# Patient Record
Sex: Female | Born: 1990
Health system: Southern US, Community
[De-identification: ages and names within clinical notes are randomized; demographics above are authoritative.]

## PROBLEM LIST (undated history)

## (undated) DIAGNOSIS — F1290 Cannabis use, unspecified, uncomplicated: Secondary | ICD-10-CM

## (undated) DIAGNOSIS — M5126 Other intervertebral disc displacement, lumbar region: Secondary | ICD-10-CM

## (undated) DIAGNOSIS — R011 Cardiac murmur, unspecified: Secondary | ICD-10-CM

## (undated) DIAGNOSIS — M5416 Radiculopathy, lumbar region: Secondary | ICD-10-CM

## (undated) DIAGNOSIS — F909 Attention-deficit hyperactivity disorder, unspecified type: Secondary | ICD-10-CM

## (undated) DIAGNOSIS — F419 Anxiety disorder, unspecified: Secondary | ICD-10-CM

## (undated) DIAGNOSIS — F32A Depression, unspecified: Secondary | ICD-10-CM

## (undated) DIAGNOSIS — G43909 Migraine, unspecified, not intractable, without status migrainosus: Secondary | ICD-10-CM

## (undated) DIAGNOSIS — D689 Coagulation defect, unspecified: Secondary | ICD-10-CM

## (undated) DIAGNOSIS — Z87442 Personal history of urinary calculi: Secondary | ICD-10-CM

## (undated) DIAGNOSIS — F4310 Post-traumatic stress disorder, unspecified: Secondary | ICD-10-CM

## (undated) DIAGNOSIS — F329 Major depressive disorder, single episode, unspecified: Secondary | ICD-10-CM

## (undated) DIAGNOSIS — R519 Headache, unspecified: Secondary | ICD-10-CM

## (undated) HISTORY — DX: Depression, unspecified: F32.A

## (undated) HISTORY — DX: Anxiety disorder, unspecified: F41.9

## (undated) HISTORY — DX: Coagulation defect, unspecified: D68.9

## (undated) HISTORY — PX: TUBAL LIGATION: SHX77

---
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
## 2008-05-10 ENCOUNTER — Emergency Department: Payer: Self-pay | Admitting: Emergency Medicine

## 2009-07-12 ENCOUNTER — Emergency Department: Payer: Self-pay | Admitting: Emergency Medicine

## 2010-04-14 ENCOUNTER — Emergency Department: Payer: Self-pay | Admitting: Emergency Medicine

## 2010-05-31 ENCOUNTER — Emergency Department: Payer: Self-pay | Admitting: Emergency Medicine

## 2011-01-23 ENCOUNTER — Inpatient Hospital Stay: Payer: Self-pay

## 2011-01-27 LAB — PATHOLOGY REPORT

## 2011-08-21 ENCOUNTER — Emergency Department: Payer: Self-pay | Admitting: Unknown Physician Specialty

## 2012-01-06 ENCOUNTER — Emergency Department: Payer: Self-pay | Admitting: Emergency Medicine

## 2012-01-06 LAB — URINALYSIS, COMPLETE
Bilirubin,UR: NEGATIVE
Nitrite: NEGATIVE
Ph: 7 (ref 4.5–8.0)
RBC,UR: 681 /HPF (ref 0–5)
Squamous Epithelial: <1

## 2012-01-07 LAB — PREGNANCY, URINE: Pregnancy Test, Urine: NEGATIVE m[IU]/mL

## 2012-01-29 ENCOUNTER — Emergency Department: Payer: Self-pay | Admitting: Emergency Medicine

## 2012-01-29 LAB — PREGNANCY, URINE: Pregnancy Test, Urine: NEGATIVE m[IU]/mL

## 2012-04-13 ENCOUNTER — Emergency Department: Payer: Self-pay | Admitting: Emergency Medicine

## 2012-11-01 ENCOUNTER — Emergency Department: Payer: Self-pay | Admitting: Internal Medicine

## 2013-09-08 ENCOUNTER — Emergency Department: Payer: Self-pay | Admitting: Emergency Medicine

## 2013-09-08 LAB — URINALYSIS, COMPLETE
BACTERIA: NONE SEEN
Bilirubin,UR: NEGATIVE
GLUCOSE, UR: NEGATIVE mg/dL (ref 0–75)
KETONE: NEGATIVE
Nitrite: NEGATIVE
Ph: 5 (ref 4.5–8.0)
Protein: 100
RBC,UR: 54 /HPF (ref 0–5)
Specific Gravity: 1.024 (ref 1.003–1.030)

## 2013-09-10 LAB — URINE CULTURE

## 2014-02-09 ENCOUNTER — Emergency Department: Payer: Self-pay | Admitting: Emergency Medicine

## 2014-02-09 LAB — COMPREHENSIVE METABOLIC PANEL
ALT: 21 U/L (ref 12–78)
AST: 24 U/L (ref 15–37)
Albumin: 3.9 g/dL (ref 3.4–5.0)
Alkaline Phosphatase: 63 U/L
Anion Gap: 6 — ABNORMAL LOW (ref 7–16)
BUN: 18 mg/dL (ref 7–18)
Bilirubin,Total: 0.3 mg/dL (ref 0.2–1.0)
CHLORIDE: 104 mmol/L (ref 98–107)
CREATININE: 0.74 mg/dL (ref 0.60–1.30)
Calcium, Total: 9.4 mg/dL (ref 8.5–10.1)
Co2: 27 mmol/L (ref 21–32)
EGFR (African American): >60
GLUCOSE: 81 mg/dL (ref 65–99)
OSMOLALITY: 275 (ref 275–301)
POTASSIUM: 3.4 mmol/L — AB (ref 3.5–5.1)
Sodium: 137 mmol/L (ref 136–145)
Total Protein: 7.3 g/dL (ref 6.4–8.2)

## 2014-02-09 LAB — URINALYSIS, COMPLETE
Bacteria: NONE SEEN
Bilirubin,UR: NEGATIVE
Glucose,UR: NEGATIVE mg/dL (ref 0–75)
Ketone: NEGATIVE
NITRITE: NEGATIVE
PH: 5 (ref 4.5–8.0)
Protein: 100
RBC,UR: 112 /HPF (ref 0–5)
SPECIFIC GRAVITY: 1.021 (ref 1.003–1.030)
Squamous Epithelial: 6
WBC UR: 1242 /HPF (ref 0–5)

## 2014-02-09 LAB — CBC
HCT: 40.2 % (ref 35.0–47.0)
HGB: 13.6 g/dL (ref 12.0–16.0)
MCH: 30.6 pg (ref 26.0–34.0)
MCHC: 33.8 g/dL (ref 32.0–36.0)
MCV: 91 fL (ref 80–100)
PLATELETS: 311 10*3/uL (ref 150–440)
RBC: 4.44 10*6/uL (ref 3.80–5.20)
RDW: 13.4 % (ref 11.5–14.5)
WBC: 16.8 10*3/uL — ABNORMAL HIGH (ref 3.6–11.0)

## 2014-04-01 ENCOUNTER — Emergency Department: Payer: Self-pay | Admitting: Emergency Medicine

## 2015-01-06 NOTE — H&P (Signed)
L&D Evaluation:  History Expanded:   HPI 24 yo G3P1011 whose EDC = 01/29/11 presents with active labor.    Blood Type O positive    Maternal HIV Negative    Maternal Syphilis Ab Nonreactive    Maternal Varicella Immune    Rubella Results immune    Presents with abdominal pain    Patient's Medical History Migraines    Patient's Surgical History none    Medications Pre Natal Vitamins    Allergies NKDA    Social History tobacco   Exam:   Vital Signs BP >140/90    General labor    Chest clear    Heart normal sinus rhythm    Abdomen gravid, tender with contractions    Estimated Fetal Weight Average for gestational age    Pelvic 6 cm    Mebranes Intact    FHT normal rate with no decels    Other Pt desires PPTL, All methods of contraception, both temporary and permanent, have been discussed with this patient.  She understands that tubal ligation is meant to be permanent and absolute although there is a one in 300 failure rate. The patient was informed about both the short and long term potential complications of tubal ligation.  She understands the risks of this surgery, including but not limited to, risks of anesthesia, hemorrhage, infection, injury to adjacent structures, bowel, bladder, and blood vessels.  She also understands that any of the [facility] physicians (Drs. Pascal Luxosenow, Weaver-Lee, Klett and [place]) might be the physician who performs the surgery and is agreeable with that.   Impression:   Impression active labor   Electronic Signatures: Towana Badgerosenow, Kennieth Plotts J (MD)  (Signed 27-May-12 10:34)  Entered: L&D Evaluation,  Authored: L&D Evaluation  Last Updated: 27-May-12 10:34

## 2015-01-27 ENCOUNTER — Encounter: Payer: Self-pay | Admitting: Emergency Medicine

## 2015-01-27 ENCOUNTER — Emergency Department
Admission: EM | Admit: 2015-01-27 | Discharge: 2015-01-27 | Disposition: A | Discharge status: Home / Self Care | Payer: 59 | Attending: Emergency Medicine | Admitting: Emergency Medicine

## 2015-01-27 ENCOUNTER — Telehealth: Payer: Self-pay | Admitting: Emergency Medicine

## 2015-01-27 DIAGNOSIS — Y9289 Other specified places as the place of occurrence of the external cause: Secondary | ICD-10-CM | POA: Insufficient documentation

## 2015-01-27 DIAGNOSIS — S3992XA Unspecified injury of lower back, initial encounter: Secondary | ICD-10-CM | POA: Diagnosis not present

## 2015-01-27 DIAGNOSIS — Y998 Other external cause status: Secondary | ICD-10-CM | POA: Diagnosis not present

## 2015-01-27 DIAGNOSIS — X58XXXA Exposure to other specified factors, initial encounter: Secondary | ICD-10-CM | POA: Insufficient documentation

## 2015-01-27 DIAGNOSIS — M5126 Other intervertebral disc displacement, lumbar region: Secondary | ICD-10-CM | POA: Diagnosis not present

## 2015-01-27 DIAGNOSIS — Z79899 Other long term (current) drug therapy: Secondary | ICD-10-CM | POA: Insufficient documentation

## 2015-01-27 DIAGNOSIS — Y9389 Activity, other specified: Secondary | ICD-10-CM | POA: Insufficient documentation

## 2015-01-27 DIAGNOSIS — M545 Low back pain: Secondary | ICD-10-CM | POA: Diagnosis present

## 2015-01-27 MED ORDER — IBUPROFEN 800 MG PO TABS: 800.0000 mg | ORAL_TABLET | Freq: Three times a day (TID) | ORAL | Status: DC | PRN

## 2015-01-27 MED ORDER — KETOROLAC TROMETHAMINE 60 MG/2ML IM SOLN
INTRAMUSCULAR | Status: AC
  Administered 2015-01-27: 60 mg
  Filled 2015-01-27: qty 2

## 2015-01-27 MED ORDER — HYDROCODONE-ACETAMINOPHEN 5-325 MG PO TABS: 1.0000 | ORAL_TABLET | ORAL | Status: DC | PRN

## 2015-01-27 MED ORDER — CYCLOBENZAPRINE HCL 10 MG PO TABS: 10.0000 mg | ORAL_TABLET | Freq: Three times a day (TID) | ORAL | Status: DC | PRN

## 2015-01-27 MED ORDER — KETOROLAC TROMETHAMINE 30 MG/ML IJ SOLN
60.0000 mg | Freq: Once | INTRAMUSCULAR | Status: AC
  Administered 2015-01-27: 60 mg via INTRAMUSCULAR

## 2015-01-27 NOTE — ED Notes (Signed)
Reports coughing this am and felt a pop in back, painful since

## 2015-01-27 NOTE — ED Notes (Signed)
Pt informed to return if any life threatening symptoms occur.  

## 2015-01-27 NOTE — ED Notes (Signed)
Pt says she need something saying whether she needs to have work restrictions as she may have to lift 40 pound items at work. i told her to be evaluated by pcp to make sure it is safe to lift that much.

## 2015-01-27 NOTE — ED Provider Notes (Signed)
[facility] Emergency Department Provider Note  ____________________________________________  Time seen: Approximately 10:28 AM  I have reviewed the triage vital signs and the nursing notes.   HISTORY  Chief Complaint Back Pain    HPI Martha Oconnor is a 24 y.o. female who presents to the emergency room for evaluation of sudden onset low back pain this morning. Patient states that she coughed and felt something pop and pull muscle in her lower back. Reports tried some heat home to no relief describes pain as 10 over 10. Unable to find a comfortable position pain is made worse with movement and nothing seems to relieve it.   History reviewed. No pertinent past medical history.  There are no active problems to display for this patient.   Past Surgical History  Procedure Laterality Date  . Tubal ligation      Current Outpatient Rx  Name  Route  Sig  Dispense  Refill  . cyclobenzaprine (FLEXERIL) 10 MG tablet   Oral   Take 1 tablet (10 mg total) by mouth every 8 (eight) hours as needed for muscle spasms.   30 tablet   1   . HYDROcodone-acetaminophen (NORCO) 5-325 MG per tablet   Oral   Take 1 tablet by mouth every 4 (four) hours as needed for moderate pain.   10 tablet   0   . ibuprofen (ADVIL,MOTRIN) 800 MG tablet   Oral   Take 1 tablet (800 mg total) by mouth every 8 (eight) hours as needed.   30 tablet   0     Allergies Ceclor  History reviewed. No pertinent family history.  Social History History  Substance Use Topics  . Smoking status: Never Smoker   . Smokeless tobacco: Not on file  . Alcohol Use: Yes    Review of Systems Constitutional: No fever/chills Eyes: No visual changes. ENT: No sore throat. Cardiovascular: Denies chest pain. Respiratory: Denies shortness of breath. Gastrointestinal: No abdominal pain.  No nausea, no vomiting.  No diarrhea.  No constipation. Genitourinary: Negative for dysuria. Musculoskeletal:  Positive for back pain. Skin: Negative for rash. Neurological: Negative for headaches, focal weakness or numbness.  10-point ROS otherwise negative.  ____________________________________________   PHYSICAL EXAM:  VITAL SIGNS: ED Triage Vitals  Enc Vitals Group     BP 01/27/15 1013 126/90 mmHg     Pulse Rate 01/27/15 1013 95     Resp 01/27/15 1013 20     Temp 01/27/15 1013 98.3 F (36.8 C)     Temp Source 01/27/15 1013 Oral     SpO2 01/27/15 1013 99 %     Weight 01/27/15 1013 175 lb (79.379 kg)     Height 01/27/15 1013 5\' 5"  (1.651 m)     Head Cir --      Peak Flow --      Pain Score 01/27/15 1014 10     Pain Loc --      Pain Edu? --      Excl. in GC? --     Constitutional: Alert and oriented. Well appearing and in no acute distress.   Cardiovascular: Normal rate, regular rhythm. Grossly normal heart sounds.  Good peripheral circulation. Respiratory: Normal respiratory effort.  No retractions. Lungs CTAB. Gastrointestinal: Soft and nontender. No distention. No abdominal bruits. No CVA tenderness. Musculoskeletal: No lower extremity tenderness nor edema.  No joint effusions. Straight leg positive bilaterally at 15. Unable to flex due to pain. Neurologic:  Normal speech and language. No gross  focal neurologic deficits are appreciated. Speech is normal. No gait instability. Skin:  Skin is warm, dry and intact. No rash noted. Psychiatric: Mood and affect are normal. Speech and behavior are normal.  ____________________________________________   LABS (all labs ordered are listed, but only abnormal results are displayed)  Labs Reviewed - No data to display ____________________________________________  EKG  Not applicable ____________________________________________  RADIOLOGY Deferred ____________________________________________   PROCEDURES  Procedure(s) performed: None  Critical Care performed: No  ____________________________________________   INITIAL  IMPRESSION / ASSESSMENT AND PLAN / ED COURSE  Pertinent labs & imaging results that were available during my care of the patient were reviewed by me and considered in my medical decision making (see chart for details).  Diagnosed with acute low back pain. Toradol 60 mg IM given Rx given for ibuprofen 800, Flexeril 10 mg, and a 2 day course of Norco 5/325 given. She voices no other complaints at this time. Will follow up with PCP or return to the ER if symptoms worsen. ____________________________________________   FINAL CLINICAL IMPRESSION(S) / ED DIAGNOSES  Final diagnoses:  Low back pain due to displacement of intervertebral disc      Evangeline Dakin, PA-C 01/27/15 1059  Jene Every, MD 01/27/15 1409

## 2015-02-03 ENCOUNTER — Ambulatory Visit: Payer: 59 | Admitting: Physical Therapy

## 2015-08-30 ENCOUNTER — Emergency Department
Admission: EM | Admit: 2015-08-30 | Discharge: 2015-08-30 | Disposition: A | Discharge status: Home / Self Care | Payer: 59 | Attending: Emergency Medicine | Admitting: Emergency Medicine

## 2015-08-30 ENCOUNTER — Encounter: Payer: Self-pay | Admitting: Emergency Medicine

## 2015-08-30 DIAGNOSIS — R05 Cough: Secondary | ICD-10-CM | POA: Diagnosis present

## 2015-08-30 DIAGNOSIS — J40 Bronchitis, not specified as acute or chronic: Secondary | ICD-10-CM

## 2015-08-30 DIAGNOSIS — J209 Acute bronchitis, unspecified: Secondary | ICD-10-CM | POA: Diagnosis not present

## 2015-08-30 MED ORDER — PSEUDOEPH-BROMPHEN-DM 30-2-10 MG/5ML PO SYRP: 10.0000 mL | ORAL_SOLUTION | Freq: Four times a day (QID) | ORAL | Status: DC | PRN

## 2015-08-30 MED ORDER — ALBUTEROL SULFATE HFA 108 (90 BASE) MCG/ACT IN AERS: 2.0000 | INHALATION_SPRAY | RESPIRATORY_TRACT | Status: DC | PRN

## 2015-08-30 MED ORDER — PREDNISONE 10 MG PO TABS: 10.0000 mg | ORAL_TABLET | ORAL | Status: DC

## 2015-08-30 MED ORDER — AZITHROMYCIN 250 MG PO TABS: ORAL_TABLET | ORAL | Status: DC

## 2015-08-30 NOTE — ED Notes (Signed)
Cough and head cold started on Christmas  Eve.  Pain with inspiration and coughing.

## 2015-08-30 NOTE — ED Notes (Signed)
Reports cough and congestion, chest hurts when coughing, bodyaches. No resp distress

## 2015-08-30 NOTE — ED Provider Notes (Signed)
[facility] Emergency Department Provider Note ?  ? ____________________________________________ ? Time seen: 7:40 AM ? I have reviewed the triage vital signs and the nursing notes.  ________ HISTORY ? Chief Complaint Cough     HPI  Martha Oconnor is a 25 y.o. female   who presents emergency department complaining of cold like symptoms 8 days. She states that initially her symptoms began with some nasal congestion and mild sore throat. Patient states that she has began to have an increase in coughing and "chest tightness". Patient states that the cough is dry in nature. Any exertion results in coughing. Patient denies any shortness of breath, headache, neck pain, nausea or vomiting, diarrhea or constipation. She denies any fevers or chills. ? ? ? History reviewed. No pertinent past medical history.  There are no active problems to display for this patient.  ? Past Surgical History  Procedure Laterality Date  . Tubal ligation     ? Current Outpatient Rx  Name  Route  Sig  Dispense  Refill  . albuterol (PROVENTIL HFA;VENTOLIN HFA) 108 (90 Base) MCG/ACT inhaler   Inhalation   Inhale 2 puffs into the lungs every 4 (four) hours as needed for wheezing or shortness of breath.   1 Inhaler   0   . azithromycin (ZITHROMAX Z-PAK) 250 MG tablet      Take 2 tablets (500 mg) on  Day 1,  followed by 1 tablet (250 mg) once daily on Days 2 through 5.   6 each   0   . brompheniramine-pseudoephedrine-DM 30-2-10 MG/5ML syrup   Oral   Take 10 mLs by mouth 4 (four) times daily as needed.   200 mL   0   . cyclobenzaprine (FLEXERIL) 10 MG tablet   Oral   Take 1 tablet (10 mg total) by mouth every 8 (eight) hours as needed for muscle spasms.   30 tablet   1   . HYDROcodone-acetaminophen (NORCO) 5-325 MG per tablet   Oral   Take 1 tablet by mouth every 4 (four) hours as needed for moderate pain.   10 tablet   0   . ibuprofen (ADVIL,MOTRIN) 800 MG  tablet   Oral   Take 1 tablet (800 mg total) by mouth every 8 (eight) hours as needed.   30 tablet   0   . predniSONE (DELTASONE) 10 MG tablet   Oral   Take 1 tablet (10 mg total) by mouth as directed.   21 tablet   0     Take on a daily basis of 6, 5, 4, 3, 2, 1    ? Allergies Ceclor ? History reviewed. No pertinent family history. ? Social History Social History  Substance Use Topics  . Smoking status: Never Smoker   . Smokeless tobacco: None  . Alcohol Use: Yes   ? Review of Systems Constitutional: no fever. Eyes: no discharge ENT: Endorses "scratchy" throat. Endorses minor nasal congestion. Cardiovascular: no chest pain. Respiratory: Positive for cough. No sob Gastrointestinal: denies abdominal pain, vomiting, diarrhea, and constipation Genitourinary: no dysuria. Negative for hematuria Musculoskeletal: Negative for back pain. Skin: Negative for rash. Neurological: Negative for headaches  10-point ROS otherwise negative.  _______________ PHYSICAL EXAM: ? VITAL SIGNS:   ED Triage Vitals  Enc Vitals Group     BP 08/30/15 0713 126/59 mmHg     Pulse Rate 08/30/15 0713 90     Resp 08/30/15 0713 18     Temp 08/30/15 0713 97.9 F (  36.6 C)     Temp Source 08/30/15 0713 Oral     SpO2 08/30/15 0713 99 %     Weight 08/30/15 0713 160 lb (72.576 kg)     Height 08/30/15 0713 5\' 5"  (1.651 m)     Head Cir --      Peak Flow --      Pain Score 08/30/15 0713 3     Pain Loc --      Pain Edu? --      Excl. in GC? --    ?  Constitutional: Alert and oriented. Well appearing and in no distress. Eyes: Conjunctivae are normal.  ENT      Head: Normocephalic and atraumatic.      Ears: EACs and TMs are unremarkable bilaterally.      Nose: Mild clear congestion/rhinnorhea.      Mouth/Throat: Mucous membranes are moist.   Hematological/Lymphatic/Immunilogical: Diffuse, mobile, nontender anterior cervical lymphadenopathy. Cardiovascular: Normal rate, regular rhythm.  Normal S1 and S2. Respiratory: Normal respiratory effort without tachypnea nor retractions. Lungs with scattered expiratory wheezing. No rales or rhonchi. No absent or decreased breath sounds. Gastrointestinal: Soft and nontender. No distention. There is no CVA tenderness. Genitourinary:  Musculoskeletal: Nontender with normal range of motion in all extremities.  Neurologic:  Normal speech and language. No gross focal neurologic deficits are appreciated. Skin:  Skin is warm, dry and intact. No rash noted. Psychiatric: Mood and affect are normal. Speech and behavior are normal. Patient exhibits appropriate insight and judgment.    LABS (all labs ordered are listed, but only abnormal results are displayed)  Labs Reviewed - No data to display  ___________ RADIOLOGY    _____________ PROCEDURES ? Procedure(s) performed:    Medications - No data to display  ______________________________________________________ INITIAL IMPRESSION / ASSESSMENT AND PLAN / ED COURSE ? Pertinent labs & imaging results that were available during my care of the patient were reviewed by me and considered in my medical decision making (see chart for details).    Patient's symptoms are consistent with bronchitis. Due to length of symptoms patient will be placed on antibiotics, steroids, albuterol, and cough medication. Patient is to follow-up with primary care for any symptoms persisting past this treatment course. Patient will return to the emergency department me increase or worsening of her symptoms. Patient verbalizes understanding of her diagnosis and the treatment plan and verbalizes compliance with same.    New Prescriptions   ALBUTEROL (PROVENTIL HFA;VENTOLIN HFA) 108 (90 BASE) MCG/ACT INHALER    Inhale 2 puffs into the lungs every 4 (four) hours as needed for wheezing or shortness of breath.   AZITHROMYCIN (ZITHROMAX Z-PAK) 250 MG TABLET    Take 2 tablets (500 mg) on  Day 1,  followed by 1 tablet  (250 mg) once daily on Days 2 through 5.   BROMPHENIRAMINE-PSEUDOEPHEDRINE-DM 30-2-10 MG/5ML SYRUP    Take 10 mLs by mouth 4 (four) times daily as needed.   PREDNISONE (DELTASONE) 10 MG TABLET    Take 1 tablet (10 mg total) by mouth as directed.   ____________________________________________ FINAL CLINICAL IMPRESSION(S) / ED DIAGNOSES?  Final diagnoses:  Bronchitis            Racheal PatchesJonathan D Dashanae Longfield, PA-C 08/30/15 [id number]  Jennye MoccasinBrian S Quigley, MD 08/30/15 [id number]

## 2015-08-30 NOTE — Discharge Instructions (Signed)

## 2016-02-02 ENCOUNTER — Other Ambulatory Visit
Admission: RE | Admit: 2016-02-02 | Discharge: 2016-02-02 | Disposition: A | Discharge status: Home / Self Care | Payer: 59 | Attending: Family Medicine | Admitting: Family Medicine

## 2016-02-02 NOTE — ED Notes (Signed)
Patient here for forensic blood draw only.  Drawn per hospital policy, and [place] [employer]. Pt tolerated well and was calm and cooperative.

## 2018-07-29 ENCOUNTER — Encounter: Payer: Self-pay | Admitting: Emergency Medicine

## 2018-07-29 ENCOUNTER — Emergency Department
Admission: EM | Admit: 2018-07-29 | Discharge: 2018-07-29 | Disposition: A | Discharge status: Home / Self Care | Payer: Medicaid Other | Attending: Emergency Medicine | Admitting: Emergency Medicine

## 2018-07-29 DIAGNOSIS — Z711 Person with feared health complaint in whom no diagnosis is made: Secondary | ICD-10-CM | POA: Diagnosis present

## 2018-07-29 NOTE — ED Notes (Signed)
Dr. Leanne Changeifenbark was in with pt and pt got upset and left because pt wanted blood work to be drawn to proof that she was not intoxicated tonight when she received a DUI.

## 2018-07-29 NOTE — Discharge Instructions (Signed)
Please follow up with your PMD as needed and return to the ED sooner for any concerns.

## 2018-07-29 NOTE — ED Provider Notes (Signed)
[facility] Emergency Department Provider Note  ____________________________________________   First MD Initiated Contact with Patient 07/29/18 [phone]     (approximate)  I have reviewed the triage vital signs and the nursing notes.   HISTORY  Chief Complaint Labs Only   HPI Martha Oconnor is a 27 y.o. female who self presents to the emergency department requesting an alcohol test to be done.  Tonight she was pulled over for driving under the influence of alcohol and says that she was breathalyzed and her blood alcohol level via breathalyzer was 0.10%.  She was cited and released.  The patient says she was "not drunk" and that she drank 2 drinks rapidly and then got in the car and drove and she feels the breathalyzer was inaccurate as she had more alcohol on her breath then was in her system.  She comes to the emergency department requesting a blood draw to prove that "I was not ever drunk".  She does not arrive with police.  There is no chain of custody.   She has no medical complaints.   History reviewed. No pertinent past medical history.  There are no active problems to display for this patient.   Past Surgical History:  Procedure Laterality Date  . TUBAL LIGATION      Prior to Admission medications   Medication Sig Start Date End Date Taking? Authorizing Provider  albuterol (PROVENTIL HFA;VENTOLIN HFA) 108 (90 Base) MCG/ACT inhaler Inhale 2 puffs into the lungs every 4 (four) hours as needed for wheezing or shortness of breath. 08/30/15   Cuthriell, Delorise Royals, PA-C  azithromycin (ZITHROMAX Z-PAK) 250 MG tablet Take 2 tablets (500 mg) on  Day 1,  followed by 1 tablet (250 mg) once daily on Days 2 through 5. 08/30/15   Cuthriell, Delorise Royals, PA-C  brompheniramine-pseudoephedrine-DM 30-2-10 MG/5ML syrup Take 10 mLs by mouth 4 (four) times daily as needed. 08/30/15   Cuthriell, Delorise Royals, PA-C  cyclobenzaprine (FLEXERIL) 10 MG tablet Take 1 tablet (10 mg total)  by mouth every 8 (eight) hours as needed for muscle spasms. 01/27/15   Beers, Charmayne Sheer, PA-C  HYDROcodone-acetaminophen (NORCO) 5-325 MG per tablet Take 1 tablet by mouth every 4 (four) hours as needed for moderate pain. 01/27/15   Beers, Charmayne Sheer, PA-C  ibuprofen (ADVIL,MOTRIN) 800 MG tablet Take 1 tablet (800 mg total) by mouth every 8 (eight) hours as needed. 01/27/15   Beers, Charmayne Sheer, PA-C  predniSONE (DELTASONE) 10 MG tablet Take 1 tablet (10 mg total) by mouth as directed. 08/30/15   Cuthriell, Delorise Royals, PA-C    Allergies Ceclor [cefaclor]  No family history on file.  Social History Social History   Tobacco Use  . Smoking status: Never Smoker  . Smokeless tobacco: Never Used  Substance Use Topics  . Alcohol use: Yes  . Drug use: Not on file    Review of Systems Constitutional: No fever/chills Cardiovascular: Denies chest pain. Respiratory: Denies shortness of breath. Gastrointestinal: No abdominal pain.  No nausea, no vomiting.   Musculoskeletal: Negative for back pain. Neurological: Negative for headaches   ____________________________________________   PHYSICAL EXAM:  VITAL SIGNS: ED Triage Vitals  Enc Vitals Group     BP 07/29/18 0527 140/85     Pulse Rate 07/29/18 0527 (!) 101     Resp 07/29/18 0527 18     Temp 07/29/18 0527 98.7 F (37.1 C)     Temp Source 07/29/18 0527 Oral     SpO2  07/29/18 0527 99 %     Weight 07/29/18 0524 180 lb (81.6 kg)     Height 07/29/18 0524 5\' 4"  (1.626 m)     Head Circumference --      Peak Flow --      Pain Score 07/29/18 0524 0     Pain Loc --      Pain Edu? --      Excl. in GC? --     Constitutional: Alert and oriented x4 somewhat strange in her erratic behavior.  Head: Atraumatic.  Pupils are midrange and brisk Cardiovascular: Regular rate and rhythm Respiratory: Normal respiratory effort.  No retractions. Neurologic:  Normal speech and language. No gross focal neurologic deficits are appreciated.  Skin:  Skin  is warm, dry and intact. No rash noted.    ____________________________________________  LABS (all labs ordered are listed, but only abnormal results are displayed)  Labs Reviewed - No data to display   __________________________________________  EKG   ____________________________________________  RADIOLOGY   ____________________________________________   DIFFERENTIAL includes but not limited to  Alcohol intoxication, anxiety, anger reaction   PROCEDURES  Procedure(s) performed: no  Procedures  Critical Care performed: no  ____________________________________________   INITIAL IMPRESSION / ASSESSMENT AND PLAN / ED COURSE  Pertinent labs & imaging results that were available during my care of the patient were reviewed by me and considered in my medical decision making (see chart for details).   As part of my medical decision making, I reviewed the following data within the electronic MEDICAL RECORD NUMBER History obtained from family if available, nursing notes, old chart and ekg, as well as notes from prior ED visits.  I explained to the patient that I would be happy to draw her blood to see what her alcohol level is however with no chain of custody that this would not be any benefit to her.  She became frustrated and said that she was under the impression that she could have an alcohol test performed and it would "stand up in court improve I was innocent".  When I told her that I did not believe this was the case she became frustrated, got up, and left.  She is not driving.  She has capacity to make medical decisions.  She has no acute medical issues.  She is discharged.      ____________________________________________   FINAL CLINICAL IMPRESSION(S) / ED DIAGNOSES  Final diagnoses:  Feared condition not demonstrated      NEW MEDICATIONS STARTED DURING THIS VISIT:  Discharge Medication List as of 07/29/2018  6:04 AM       Note:  This document was  prepared using Dragon voice recognition software and may include unintentional dictation errors.      Merrily Brittleifenbark, Brendt Dible, MD 07/29/18 [phone]

## 2018-07-29 NOTE — ED Triage Notes (Addendum)
Patient states that she was pulled by police and is now here requesting a blood draw for court.

## 2019-02-08 LAB — HM HIV SCREENING LAB: HM HIV Screening: NEGATIVE

## 2019-04-22 ENCOUNTER — Other Ambulatory Visit: Payer: Self-pay

## 2019-04-22 DIAGNOSIS — Z20822 Contact with and (suspected) exposure to covid-19: Secondary | ICD-10-CM

## 2019-04-23 LAB — NOVEL CORONAVIRUS, NAA: SARS-CoV-2, NAA: NOT DETECTED

## 2019-04-23 LAB — SPECIMEN STATUS REPORT

## 2019-05-27 ENCOUNTER — Ambulatory Visit: Payer: Medicaid Other | Admitting: Physician Assistant

## 2019-05-27 ENCOUNTER — Encounter: Payer: Self-pay | Admitting: Physician Assistant

## 2019-05-27 ENCOUNTER — Other Ambulatory Visit: Payer: Self-pay

## 2019-05-27 DIAGNOSIS — Z113 Encounter for screening for infections with a predominantly sexual mode of transmission: Secondary | ICD-10-CM | POA: Diagnosis not present

## 2019-05-27 DIAGNOSIS — Z9851 Tubal ligation status: Secondary | ICD-10-CM | POA: Insufficient documentation

## 2019-05-27 LAB — WET PREP FOR TRICH, YEAST, CLUE
Trichomonas Exam: NEGATIVE
Yeast Exam: NEGATIVE

## 2019-05-27 NOTE — Progress Notes (Signed)
    STI clinic/screening visit  Subjective:  Martha Oconnor is a 28 y.o. female being seen today for an STI screening visit. The patient reports they do not have symptoms.  Patient has the following medical conditions:  There are no active problems to display for this patient.    Chief Complaint  Patient presents with  . SEXUALLY TRANSMITTED DISEASE    HPI  Patient reports that she would like a screening today.  Denies any symptoms or concerns.    See flowsheet for further details and programmatic requirements.    The following portions of the patient's history were reviewed and updated as appropriate: allergies, current medications, past medical history, past social history, past surgical history and problem list.  Objective:  There were no vitals filed for this visit.  Physical Exam Constitutional:      General: She is not in acute distress.    Appearance: Normal appearance.  HENT:     Head: Normocephalic and atraumatic.     Mouth/Throat:     Mouth: Mucous membranes are moist.     Pharynx: Oropharynx is clear. No oropharyngeal exudate or posterior oropharyngeal erythema.  Eyes:     Conjunctiva/sclera: Conjunctivae normal.  Neck:     Musculoskeletal: Neck supple.  Pulmonary:     Effort: Pulmonary effort is normal.  Abdominal:     Palpations: Abdomen is soft. There is no mass.     Tenderness: There is no abdominal tenderness. There is no guarding or rebound.  Genitourinary:    General: Normal vulva.     Rectum: Normal.     Comments: External genitalia/pubic area without nits, lice, edema, erythema, lesions and inguinal adenopathy. Vagina with normal mucosa and discharge. Cervix without visible lesions. Uterus firm, mobile, nt, no masses, no CMT, no adnexal tenderness or fullness. Lymphadenopathy:     Cervical: No cervical adenopathy.  Skin:    General: Skin is warm and dry.     Findings: No bruising, erythema, lesion or rash.  Neurological:     Mental Status:  She is alert and oriented to person, place, and time.  Psychiatric:        Mood and Affect: Mood normal.        Behavior: Behavior normal.        Thought Content: Thought content normal.        Judgment: Judgment normal.       Assessment and Plan:  Martha Oconnor is a 28 y.o. female presenting to the [facility] Department for STI screening  1. Screening for STD (sexually transmitted disease) Patient without symptoms.   Rec condoms with all sex. Await test results.  Counseled that RN will call if needs to RTC for treatment once results are back. PCP list given to patient to establish care for age appropriate screenings and regular PE and paps. - WET PREP FOR [place], [place], [place] [facility] - Syphilis Serology, [place] [facility]     No follow-ups on file.  No future appointments.  Jerene Dilling, PA

## 2019-05-27 NOTE — Progress Notes (Signed)
Here today for STD screening. Accepts bloodwork. Augustin Bun, RN  Wet Prep results reviewed. Per standing orders no treatment indicated. Laquan Ludden, RN   

## 2019-09-17 ENCOUNTER — Ambulatory Visit: Payer: Medicaid Other | Attending: Internal Medicine

## 2019-09-17 DIAGNOSIS — Z20822 Contact with and (suspected) exposure to covid-19: Secondary | ICD-10-CM

## 2019-09-18 LAB — NOVEL CORONAVIRUS, NAA: SARS-CoV-2, NAA: NOT DETECTED

## 2019-11-01 ENCOUNTER — Other Ambulatory Visit: Payer: Self-pay

## 2019-11-01 ENCOUNTER — Ambulatory Visit: Payer: Medicaid Other | Admitting: Physician Assistant

## 2019-11-01 ENCOUNTER — Encounter: Payer: Self-pay | Admitting: Physician Assistant

## 2019-11-01 DIAGNOSIS — Z113 Encounter for screening for infections with a predominantly sexual mode of transmission: Secondary | ICD-10-CM

## 2019-11-01 LAB — WET PREP FOR TRICH, YEAST, CLUE
Trichomonas Exam: NEGATIVE
Yeast Exam: NEGATIVE

## 2019-11-01 NOTE — Progress Notes (Signed)
Wet mount reviewed and no interventions required per standing order. Jossie Ng, RN

## 2019-11-02 NOTE — Progress Notes (Signed)
  [facility] Department STI clinic/screening visit  Subjective:  Martha Oconnor is a 29 y.o. female being seen today for an STI screening visit. The patient reports they do not have symptoms.  Patient reports that they do not desire a pregnancy in the next year.   They reported they are not interested in discussing contraception today.  Patient's last menstrual period was 10/12/2019 (exact date).   Patient has the following medical conditions:   Patient Active Problem List   Diagnosis Date Noted  . History of bilateral tubal ligation 05/27/2019    Chief Complaint  Patient presents with  . SEXUALLY TRANSMITTED DISEASE    HPI  Patient reports that she is not having any symptoms but would like a screening today.  Denies changes to her history and regular medicines.    See flowsheet for further details and programmatic requirements.    The following portions of the patient's history were reviewed and updated as appropriate: allergies, current medications, past medical history, past social history, past surgical history and problem list.  Objective:  There were no vitals filed for this visit.  Physical Exam Constitutional:      General: She is not in acute distress.    Appearance: Normal appearance. She is normal weight.  HENT:     Head: Normocephalic.     Comments: No nits, lice, or hair loss. No cervical, supraclavicular or axillary adenopathy.    Mouth/Throat:     Mouth: Mucous membranes are moist.     Pharynx: Oropharynx is clear. No oropharyngeal exudate or posterior oropharyngeal erythema.  Eyes:     Conjunctiva/sclera: Conjunctivae normal.  Pulmonary:     Effort: Pulmonary effort is normal.  Abdominal:     Palpations: Abdomen is soft. There is no mass.     Tenderness: There is no abdominal tenderness. There is no guarding or rebound.  Genitourinary:    General: Normal vulva.     Rectum: Normal.     Comments: External genitalia/pubic area without nits,  lice, edema, erythema,lesions and inguinal adenopathy. Vagina with normal mucosa and discharge. Cervix without visible lesions. Uterus firm, mobile, nt, no masses, no CMT, no adnexal tenderness or fullness. Musculoskeletal:     Cervical back: Neck supple. No tenderness.  Skin:    General: Skin is warm and dry.     Findings: No bruising, erythema, lesion or rash.     Comments: Multiple tattoos.  Neurological:     Mental Status: She is alert and oriented to person, place, and time.  Psychiatric:        Mood and Affect: Mood normal.        Behavior: Behavior normal.        Thought Content: Thought content normal.        Judgment: Judgment normal.      Assessment and Plan:  Martha Oconnor is a 29 y.o. female presenting to the [facility] Department for STI screening  1. Screening for STD (sexually transmitted disease) Patient into clinic without symptoms. Rec condoms with all sex. Await test results.  Counseled that RN will call if needs to RTC for treatment once results are back. - WET PREP FOR TRICH, YEAST, CLUE - Gonococcus culture - Chlamydia/Gonorrhea [place] [facility] - HIV [place] [facility] - Syphilis Serology, [place] [facility]     No follow-ups on file.  No future appointments.  Matt Holmes, PA

## 2019-11-05 LAB — GONOCOCCUS CULTURE

## 2020-01-15 ENCOUNTER — Encounter: Payer: Self-pay | Admitting: Physician Assistant

## 2020-01-15 ENCOUNTER — Other Ambulatory Visit: Payer: Self-pay

## 2020-01-15 ENCOUNTER — Ambulatory Visit: Payer: Self-pay | Admitting: Physician Assistant

## 2020-01-15 DIAGNOSIS — Z113 Encounter for screening for infections with a predominantly sexual mode of transmission: Secondary | ICD-10-CM

## 2020-01-15 LAB — WET PREP FOR TRICH, YEAST, CLUE
Trichomonas Exam: NEGATIVE
Yeast Exam: NEGATIVE

## 2020-01-15 NOTE — Progress Notes (Signed)
  [facility] Department STI clinic/screening visit  Subjective:  Martha Oconnor is a 29 y.o. female being seen today for an STI screening visit. The patient reports they do not have symptoms.  Patient reports that they do not desire a pregnancy in the next year.   They reported they are not interested in discussing contraception today.  No LMP recorded.   Patient has the following medical conditions:   Patient Active Problem List   Diagnosis Date Noted  . History of bilateral tubal ligation 05/27/2019    Chief Complaint  Patient presents with  . SEXUALLY TRANSMITTED DISEASE    STD screening including bloodwork    HPI  Patient reports that she does not have any symptoms but would like a screening today.  LMP 01/07/2020 and normal.  BTL is BCM.  Denies current symptoms of anxiety or depression.   See flowsheet for further details and programmatic requirements.    The following portions of the patient's history were reviewed and updated as appropriate: allergies, current medications, past medical history, past social history, past surgical history and problem list.  Objective:  There were no vitals filed for this visit.  Physical Exam Constitutional:      General: She is not in acute distress.    Appearance: Normal appearance.  HENT:     Head: Normocephalic and atraumatic.     Comments: No nits, lice or hair loss. No cervical, supraclavicular or axillary adenopathy.    Mouth/Throat:     Mouth: Mucous membranes are moist.     Pharynx: Oropharynx is clear. No oropharyngeal exudate or posterior oropharyngeal erythema.  Eyes:     Conjunctiva/sclera: Conjunctivae normal.  Pulmonary:     Effort: Pulmonary effort is normal.  Abdominal:     Palpations: Abdomen is soft. There is no mass.     Tenderness: There is no abdominal tenderness. There is no guarding or rebound.  Genitourinary:    General: Normal vulva.     Rectum: Normal.     Comments: External  genitalia/pubic area without nits, lice, edema, erythema, lesions and inguinal adenopathy. Vagina with normal mucosa and discharge. Cervix without visible lesions. Uterus firm, mobile, nt, no masses, no CMT, no adnexal tenderness or fullness. Musculoskeletal:     Cervical back: Neck supple. No tenderness.  Skin:    General: Skin is warm and dry.     Findings: No bruising, erythema, lesion or rash.  Neurological:     Mental Status: She is alert and oriented to person, place, and time.  Psychiatric:        Mood and Affect: Mood normal.        Behavior: Behavior normal.        Thought Content: Thought content normal.        Judgment: Judgment normal.      Assessment and Plan:  Martha Oconnor is a 29 y.o. female presenting to the [facility] Department for STI screening  1. Screening for STD (sexually transmitted disease) Patient into clinic without symptoms. Rec condoms with all sex. Await test results.  Counseled that RN will call if needs to RTC for treatment once results are back. - WET PREP FOR TRICH, YEAST, CLUE - Chlamydia/Gonorrhea [place] [facility] - HIV [place] [facility] - Syphilis Serology, [place] [facility]     No follow-ups on file.  No future appointments.  Matt Holmes, PA

## 2020-01-15 NOTE — Progress Notes (Signed)
Wet mount reviewed, no tx per provider orders. Provider orders completed. 

## 2020-05-27 ENCOUNTER — Ambulatory Visit: Payer: Medicaid Other | Admitting: Family Medicine

## 2020-05-27 ENCOUNTER — Encounter: Payer: Self-pay | Admitting: Family Medicine

## 2020-05-27 ENCOUNTER — Other Ambulatory Visit: Payer: Self-pay

## 2020-05-27 DIAGNOSIS — Z113 Encounter for screening for infections with a predominantly sexual mode of transmission: Secondary | ICD-10-CM

## 2020-05-27 LAB — WET PREP FOR TRICH, YEAST, CLUE
Trichomonas Exam: NEGATIVE
Yeast Exam: NEGATIVE

## 2020-05-27 NOTE — Progress Notes (Signed)
  [facility] Department STI clinic/screening visit  Subjective:  Martha Oconnor is a 29 y.o. female being seen today for an STI screening visit. The patient reports they do not have symptoms.  Patient reports that they do not desire a pregnancy in the next year.   They reported they are not interested in discussing contraception today.  Patient's last menstrual period was 05/14/2020.   Patient has the following medical conditions:   Patient Active Problem List   Diagnosis Date Noted  . History of bilateral tubal ligation 05/27/2019    No chief complaint on file.   HPI  Patient reports she is here for STD screen denies symptoms.  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  Last HIV test per patient/review of record was 01/05/2020 Patient reports last pap was unknown.   See flowsheet for further details and programmatic requirements.    The following portions of the patient's history were reviewed and updated as appropriate: allergies, current medications, past medical history, past social history, past surgical history and problem list.  Objective:  There were no vitals filed for this visit.  Physical Exam Vitals and nursing note reviewed.  Constitutional:      Appearance: Normal appearance.  HENT:     Head: Normocephalic and atraumatic.     Mouth/Throat:     Mouth: Mucous membranes are moist.     Pharynx: Oropharynx is clear. No oropharyngeal exudate or posterior oropharyngeal erythema.  Pulmonary:     Effort: Pulmonary effort is normal.  Abdominal:     General: Abdomen is flat.     Palpations: There is no mass.     Tenderness: There is no abdominal tenderness. There is no rebound.  Genitourinary:    General: Normal vulva.     Exam position: Lithotomy position.     Pubic Area: No rash or pubic lice.      Labia:        Right: No rash or lesion.        Left: No rash or lesion.      Vagina: Normal. No vaginal discharge, erythema,  bleeding or lesions.     Cervix: No cervical motion tenderness, discharge, friability, lesion or erythema.  Lymphadenopathy:     Head:     Right side of head: No preauricular or posterior auricular adenopathy.     Left side of head: No preauricular or posterior auricular adenopathy.     Cervical: No cervical adenopathy.     Upper Body:     Right upper body: No supraclavicular or axillary adenopathy.     Left upper body: No supraclavicular or axillary adenopathy.     Lower Body: No right inguinal adenopathy. No left inguinal adenopathy.  Skin:    General: Skin is warm and dry.     Findings: No rash.  Neurological:     Mental Status: She is alert and oriented to person, place, and time.      Assessment and Plan:  Martha Oconnor is a 29 y.o. female presenting to the [facility] Department for STI screening  1. Screening examination for venereal disease - WET PREP FOR TRICH, YEAST, CLUE - Chlamydia/Gonorrhea [place] [facility] - HIV [place] [facility] - Syphilis Serology, [place] [facility]  Co client that she would notified of positve test results   Return if symptoms worsen or fail to improve.  No future appointments.  Larene Pickett, FNP

## 2020-05-27 NOTE — Progress Notes (Signed)
Wet mount reviewed, no tx per provider orders. Provider orders complete.

## 2021-02-04 ENCOUNTER — Emergency Department (HOSPITAL_COMMUNITY)
Admission: EM | Admit: 2021-02-04 | Discharge: 2021-02-04 | Disposition: A | Discharge status: Home / Self Care | Payer: Medicaid Other | Attending: Emergency Medicine | Admitting: Emergency Medicine

## 2021-02-04 ENCOUNTER — Encounter (HOSPITAL_COMMUNITY): Payer: Self-pay

## 2021-02-04 ENCOUNTER — Other Ambulatory Visit: Payer: Self-pay

## 2021-02-04 ENCOUNTER — Emergency Department (HOSPITAL_COMMUNITY): Payer: Medicaid Other

## 2021-02-04 DIAGNOSIS — Z87891 Personal history of nicotine dependence: Secondary | ICD-10-CM | POA: Diagnosis not present

## 2021-02-04 DIAGNOSIS — N201 Calculus of ureter: Secondary | ICD-10-CM

## 2021-02-04 DIAGNOSIS — N132 Hydronephrosis with renal and ureteral calculous obstruction: Secondary | ICD-10-CM | POA: Insufficient documentation

## 2021-02-04 DIAGNOSIS — E876 Hypokalemia: Secondary | ICD-10-CM | POA: Diagnosis not present

## 2021-02-04 DIAGNOSIS — D72829 Elevated white blood cell count, unspecified: Secondary | ICD-10-CM | POA: Insufficient documentation

## 2021-02-04 DIAGNOSIS — Z79899 Other long term (current) drug therapy: Secondary | ICD-10-CM | POA: Insufficient documentation

## 2021-02-04 DIAGNOSIS — R109 Unspecified abdominal pain: Secondary | ICD-10-CM | POA: Diagnosis present

## 2021-02-04 HISTORY — DX: Attention-deficit hyperactivity disorder, unspecified type: F90.9

## 2021-02-04 HISTORY — DX: Post-traumatic stress disorder, unspecified: F43.10

## 2021-02-04 LAB — COMPREHENSIVE METABOLIC PANEL
ALT: 20 U/L (ref 0–44)
AST: 19 U/L (ref 15–41)
Albumin: 4.7 g/dL (ref 3.5–5.0)
Alkaline Phosphatase: 62 U/L (ref 38–126)
Anion gap: 12 (ref 5–15)
BUN: 11 mg/dL (ref 6–20)
CO2: 23 mmol/L (ref 22–32)
Calcium: 9.6 mg/dL (ref 8.9–10.3)
Chloride: 103 mmol/L (ref 98–111)
Creatinine, Ser: 0.63 mg/dL (ref 0.44–1.00)
GFR, Estimated: >60 mL/min (ref >60)
Glucose, Bld: 108 mg/dL — ABNORMAL HIGH (ref 70–99)
Potassium: 3 mmol/L — ABNORMAL LOW (ref 3.5–5.1)
Sodium: 138 mmol/L (ref 135–145)
Total Bilirubin: 0.6 mg/dL (ref 0.3–1.2)
Total Protein: 8.3 g/dL — ABNORMAL HIGH (ref 6.5–8.1)

## 2021-02-04 LAB — CBC WITH DIFFERENTIAL/PLATELET
Abs Immature Granulocytes: 0.04 10*3/uL (ref 0.00–0.07)
Basophils Absolute: 0.1 10*3/uL (ref 0.0–0.1)
Basophils Relative: 0 %
Eosinophils Absolute: 0 10*3/uL (ref 0.0–0.5)
Eosinophils Relative: 0 %
HCT: 44 % (ref 36.0–46.0)
Hemoglobin: 14.8 g/dL (ref 12.0–15.0)
Immature Granulocytes: 0 %
Lymphocytes Relative: 26 %
Lymphs Abs: 3.2 10*3/uL (ref 0.7–4.0)
MCH: 30.5 pg (ref 26.0–34.0)
MCHC: 33.6 g/dL (ref 30.0–36.0)
MCV: 90.7 fL (ref 80.0–100.0)
Monocytes Absolute: 0.7 10*3/uL (ref 0.1–1.0)
Monocytes Relative: 6 %
Neutro Abs: 8.2 10*3/uL — ABNORMAL HIGH (ref 1.7–7.7)
Neutrophils Relative %: 68 %
Platelets: 365 10*3/uL (ref 150–400)
RBC: 4.85 MIL/uL (ref 3.87–5.11)
RDW: 12.5 % (ref 11.5–15.5)
WBC: 12.2 10*3/uL — ABNORMAL HIGH (ref 4.0–10.5)
nRBC: 0 % (ref 0.0–0.2)

## 2021-02-04 LAB — URINALYSIS, ROUTINE W REFLEX MICROSCOPIC
Bilirubin Urine: NEGATIVE
Glucose, UA: NEGATIVE mg/dL
Ketones, ur: 80 mg/dL — AB
Leukocytes,Ua: NEGATIVE
Nitrite: NEGATIVE
Protein, ur: 30 mg/dL — AB
RBC / HPF: >50 RBC/hpf — ABNORMAL HIGH (ref 0–5)
Specific Gravity, Urine: 1.019 (ref 1.005–1.030)
pH: 9 — ABNORMAL HIGH (ref 5.0–8.0)

## 2021-02-04 LAB — I-STAT BETA HCG BLOOD, ED (MC, WL, AP ONLY): I-stat hCG, quantitative: <5 m[IU]/mL (ref <5)

## 2021-02-04 LAB — LIPASE, BLOOD: Lipase: 30 U/L (ref 11–51)

## 2021-02-04 MED ORDER — HYDROCODONE-ACETAMINOPHEN 5-325 MG PO TABS: 1.0000 | ORAL_TABLET | Freq: Four times a day (QID) | ORAL | 0 refills | Status: DC | PRN

## 2021-02-04 MED ORDER — SODIUM CHLORIDE 0.9 % IV BOLUS
1000.0000 mL | Freq: Once | INTRAVENOUS | Status: AC
  Administered 2021-02-04: 1000 mL via INTRAVENOUS

## 2021-02-04 MED ORDER — ONDANSETRON HCL 4 MG/2ML IJ SOLN
4.0000 mg | Freq: Once | INTRAMUSCULAR | Status: AC
  Administered 2021-02-04: 4 mg via INTRAVENOUS
  Filled 2021-02-04: qty 2

## 2021-02-04 MED ORDER — POTASSIUM CHLORIDE CRYS ER 20 MEQ PO TBCR
40.0000 meq | EXTENDED_RELEASE_TABLET | Freq: Once | ORAL | Status: DC
  Filled 2021-02-04: qty 2

## 2021-02-04 MED ORDER — ONDANSETRON HCL 4 MG PO TABS: 4.0000 mg | ORAL_TABLET | Freq: Four times a day (QID) | ORAL | 0 refills | Status: DC

## 2021-02-04 MED ORDER — MORPHINE SULFATE (PF) 4 MG/ML IV SOLN
4.0000 mg | Freq: Once | INTRAVENOUS | Status: AC
  Administered 2021-02-04: 4 mg via INTRAVENOUS
  Filled 2021-02-04: qty 1

## 2021-02-04 MED ORDER — KETOROLAC TROMETHAMINE 30 MG/ML IJ SOLN
30.0000 mg | Freq: Once | INTRAMUSCULAR | Status: AC
  Administered 2021-02-04: 30 mg via INTRAVENOUS
  Filled 2021-02-04: qty 1

## 2021-02-04 MED ORDER — TAMSULOSIN HCL 0.4 MG PO CAPS
0.4000 mg | ORAL_CAPSULE | Freq: Every day | ORAL | Status: DC
  Filled 2021-02-04: qty 1

## 2021-02-04 MED ORDER — HYDROMORPHONE HCL 1 MG/ML IJ SOLN
0.5000 mg | Freq: Once | INTRAMUSCULAR | Status: AC
  Administered 2021-02-04: 0.5 mg via INTRAVENOUS
  Filled 2021-02-04: qty 1

## 2021-02-04 MED ORDER — METOCLOPRAMIDE HCL 5 MG/ML IJ SOLN
5.0000 mg | Freq: Once | INTRAMUSCULAR | Status: AC
  Administered 2021-02-04: 5 mg via INTRAVENOUS
  Filled 2021-02-04: qty 2

## 2021-02-04 MED ORDER — TAMSULOSIN HCL 0.4 MG PO CAPS: 0.4000 mg | ORAL_CAPSULE | Freq: Every day | ORAL | 0 refills | Status: AC

## 2021-02-04 MED ORDER — SODIUM CHLORIDE 0.9 % IV SOLN
12.5000 mg | Freq: Four times a day (QID) | INTRAVENOUS | Status: DC | PRN
  Administered 2021-02-04: 12.5 mg via INTRAVENOUS
  Filled 2021-02-04: qty 12.5

## 2021-02-04 MED ORDER — SULFAMETHOXAZOLE-TRIMETHOPRIM 800-160 MG PO TABS: 1.0000 | ORAL_TABLET | Freq: Two times a day (BID) | ORAL | 0 refills | Status: AC

## 2021-02-04 NOTE — ED Provider Notes (Signed)
Emergency Medicine Provider Triage Evaluation Note  Martha Oconnor , a 30 y.o. female  was evaluated in triage.  Pt complains of left flank pain that started suddenly pta. Reports associated nvd as well today.  Review of Systems  Positive: Left flank pain, nvd Negative: Dysuria, frequency, urgency  Physical Exam  BP (!) 153/94 (BP Location: Left Arm)   Pulse 83   Temp 98.1 F (36.7 C) (Oral)   Resp (!) 22   Ht 5\' 4"  (1.626 m)   Wt 77.1 kg   SpO2 100%   BMI 29.18 kg/m  Gen:   Awake, no distress   Resp:  Normal effort  MSK:   Moves extremities without difficulty  Other:  Left cva ttp, no abd ttp  Medical Decision Making  Medically screening exam initiated at 12:45 PM.  Appropriate orders placed.  Martha Oconnor was informed that the remainder of the evaluation will be completed by another provider, this initial triage assessment does not replace that evaluation, and the importance of remaining in the ED until their evaluation is complete.     [name], PA-C 02/04/21 1246    04/06/21, MD 02/10/21 [phone]

## 2021-02-04 NOTE — ED Triage Notes (Signed)
Patient c/o left flank pain x 45 minutes and nausea and diarrhea. Patient denies any dysuria or hematuria.

## 2021-02-04 NOTE — ED Notes (Signed)
Patient transported to CT via stretcher.

## 2021-02-04 NOTE — ED Notes (Signed)
Given water and crackers, encouraged to eat/drink. Pt requesting to wait a little bit on food and PO potassium, still a little nauseated.

## 2021-02-04 NOTE — Discharge Instructions (Addendum)
You were given a prescription for antibiotics. Please take the antibiotic prescription fully.   A culture was sent of your urine today to determine if there is any bacterial growth. If the results of the culture are positive and you require an antibiotic or a change of your prescribed antibiotic you will be contacted by the hospital. If the results are negative you will not be contacted.  Today you were diagnosed with a kidney stone on your CT scan.  You will be given a prescription for Flomax, pain medication, and nausea medication.  You should not drive, work, or operate machinery while taking the pain medication as it can make you very drowsy.  You will need to follow-up with urology for reevaluation and for further treatment of your kidney stone.  You will need to return to the emergency department for any fevers, persistent pain, persistent vomiting, inability to urinate, or any new or worsening symptoms.

## 2021-02-04 NOTE — ED Provider Notes (Signed)
[facility]ENC[facility]PT Provider Note   CSN: [id number] Arrival date & time: 02/04/21  1141     History Chief Complaint  Patient presents with   Flank Pain    Martha Oconnor is a 30 y.o. female.  HPI  30 year old female history of ADHD, anxiety, depression, PTSD, who presents the emergency department today for evaluation of flank pain.  States she was in a job interview prior to arrival when she had sudden onset of left flank pain.  Pain constant and severe in nature.  She had associated nausea, vomiting, diarrhea.  Denies any associated urinary symptoms or fevers.  Past Medical History:  Diagnosis Date   ADHD    Anxiety    Depression    PTSD (post-traumatic stress disorder)     Patient Active Problem List   Diagnosis Date Noted   History of bilateral tubal ligation 05/27/2019    Past Surgical History:  Procedure Laterality Date   TUBAL LIGATION       OB History   No obstetric history on file.     History reviewed. No pertinent family history.  Social History   Tobacco Use   Smoking status: Former    Years: 8.00    Pack years: 0.00    Types: Cigarettes    Quit date: 04/29/2013    Years since quitting: 7.7   Smokeless tobacco: Never  Vaping Use   Vaping Use: Never used  Substance Use Topics   Alcohol use: Not Currently   Drug use: Not Currently    Types: Marijuana    Home Medications Prior to Admission medications   Medication Sig Start Date End Date Taking? Authorizing Provider  cyclobenzaprine (FLEXERIL) 10 MG tablet Take 1 tablet (10 mg total) by mouth every 8 (eight) hours as needed for muscle spasms. 01/27/15  Yes Beers, Charmayne Sheer, PA-C  HYDROcodone-acetaminophen (NORCO/VICODIN) 5-325 MG tablet Take 1 tablet by mouth every 6 (six) hours as needed. 02/04/21  Yes Chauntel Windsor S, PA-C  ondansetron (ZOFRAN) 4 MG tablet Take 1 tablet (4 mg total) by mouth every 6 (six) hours. 02/04/21  Yes Beniah Magnan S, PA-C   sulfamethoxazole-trimethoprim (BACTRIM DS) 800-160 MG tablet Take 1 tablet by mouth 2 (two) times daily for 3 days. 02/04/21 02/07/21 Yes Nicolo Tomko S, PA-C  tamsulosin (FLOMAX) 0.4 MG CAPS capsule Take 1 capsule (0.4 mg total) by mouth daily. 02/04/21 03/06/21 Yes Rufus Beske S, PA-C  VYVANSE 30 MG capsule Take 30 mg by mouth every morning. 01/12/21  Yes [provider]  albuterol (PROVENTIL HFA;VENTOLIN HFA) 108 (90 Base) MCG/ACT inhaler Inhale 2 puffs into the lungs every 4 (four) hours as needed for wheezing or shortness of breath. Patient not taking: No sig reported 08/30/15   Cuthriell, Delorise Royals, PA-C  azithromycin (ZITHROMAX Z-PAK) 250 MG tablet Take 2 tablets (500 mg) on  Day 1,  followed by 1 tablet (250 mg) once daily on Days 2 through 5. Patient not taking: No sig reported 08/30/15   Cuthriell, Delorise Royals, PA-C  brompheniramine-pseudoephedrine-DM 30-2-10 MG/5ML syrup Take 10 mLs by mouth 4 (four) times daily as needed. Patient not taking: No sig reported 08/30/15   Cuthriell, Delorise Royals, PA-C  ibuprofen (ADVIL,MOTRIN) 800 MG tablet Take 1 tablet (800 mg total) by mouth every 8 (eight) hours as needed. Patient not taking: No sig reported 01/27/15   Beers, Charmayne Sheer, PA-C  predniSONE (DELTASONE) 10 MG tablet Take 1 tablet (10 mg total) by mouth as directed. Patient not  taking: No sig reported 08/30/15   Cuthriell, Delorise Royals, PA-C    Allergies    Ceclor [cefaclor]  Review of Systems   Review of Systems  Constitutional:  Negative for fever.  HENT:  Negative for ear pain and sore throat.   Eyes:  Negative for visual disturbance.  Respiratory:  Negative for cough and shortness of breath.   Cardiovascular:  Negative for chest pain.  Gastrointestinal:  Positive for nausea and vomiting. Negative for abdominal pain, constipation and diarrhea.  Genitourinary:  Positive for flank pain. Negative for dysuria and hematuria.  Musculoskeletal:  Negative for back pain.  Skin:  Negative for  rash.  Neurological:  Negative for seizures and syncope.  All other systems reviewed and are negative.  Physical Exam Updated Vital Signs BP (!) 163/108 (BP Location: Left Arm)   Pulse (!) 59   Temp 98.1 F (36.7 C) (Oral)   Resp 18   Ht 5\' 4"  (1.626 m)   Wt 77.1 kg   LMP 02/04/2021   SpO2 100%   BMI 29.18 kg/m   Physical Exam Vitals and nursing note reviewed.  Constitutional:      General: She is not in acute distress.    Appearance: She is well-developed.  HENT:     Head: Normocephalic and atraumatic.  Eyes:     Conjunctiva/sclera: Conjunctivae normal.  Cardiovascular:     Rate and Rhythm: Normal rate and regular rhythm.     Pulses: Normal pulses.     Heart sounds: Normal heart sounds. No murmur heard. Pulmonary:     Effort: Pulmonary effort is normal. No respiratory distress.     Breath sounds: Normal breath sounds. No wheezing, rhonchi or rales.  Abdominal:     General: Bowel sounds are normal.     Palpations: Abdomen is soft.     Tenderness: There is no abdominal tenderness. There is left CVA tenderness. There is no right CVA tenderness, guarding or rebound.  Musculoskeletal:     Cervical back: Neck supple.  Skin:    General: Skin is warm and dry.  Neurological:     Mental Status: She is alert.    ED Results / Procedures / Treatments   Labs (all labs ordered are listed, but only abnormal results are displayed) Labs Reviewed  COMPREHENSIVE METABOLIC PANEL - Abnormal; Notable for the following components:      Result Value   Potassium 3.0 (*)    Glucose, Bld 108 (*)    Total Protein 8.3 (*)    All other components within normal limits  CBC WITH DIFFERENTIAL/PLATELET - Abnormal; Notable for the following components:   WBC 12.2 (*)    Neutro Abs 8.2 (*)    All other components within normal limits  URINALYSIS, ROUTINE W REFLEX MICROSCOPIC - Abnormal; Notable for the following components:   APPearance TURBID (*)    pH 9.0 (*)    Hgb urine dipstick  MODERATE (*)    Ketones, ur 80 (*)    Protein, ur 30 (*)    RBC / HPF >50 (*)    Bacteria, UA RARE (*)    All other components within normal limits  URINE CULTURE  LIPASE, BLOOD  I-STAT BETA HCG BLOOD, ED ([facility], [facility], [facility] ONLY)    EKG None  Radiology CT Renal Stone Study  Result Date: 02/04/2021 CLINICAL DATA:  Left flank pain EXAM: CT ABDOMEN AND PELVIS WITHOUT CONTRAST TECHNIQUE: Multidetector CT imaging of the abdomen and pelvis was performed following the standard protocol  without IV contrast. COMPARISON:  None. FINDINGS: Lower chest: Included lung bases are clear.  Heart size is normal. Hepatobiliary: Unremarkable unenhanced appearance of the liver. No focal liver lesion identified. Gallbladder within normal limits. No hyperdense gallstone. No biliary dilatation. Pancreas: Unremarkable. No pancreatic ductal dilatation or surrounding inflammatory changes. Spleen: Normal in size without focal abnormality. Adrenals/Urinary Tract: Unremarkable adrenal glands. Obstructing 5.5 mm stone within the distal left ureter along the left pelvic sidewall resulting in moderate left hydroureteronephrosis. Additional punctate 1 mm upper pole left renal stone. Duplicated right renal collecting system. No right-sided renal or ureteral calculi. No right hydronephrosis. Stomach/Bowel: Stomach is within normal limits. Appendix appears normal (series 2, image 64). No evidence of bowel wall thickening, distention, or inflammatory changes. Vascular/Lymphatic: No significant vascular findings are evident on unenhanced CT. No enlarged abdominal or pelvic lymph nodes. Reproductive: Uterus and bilateral adnexa are unremarkable. Other: No free fluid. No abdominopelvic fluid collection. No pneumoperitoneum. No abdominal wall hernia. Musculoskeletal: No acute or significant osseous findings. IMPRESSION: 1. Obstructing 5.5 mm stone within the distal left ureter along the left pelvic sidewall resulting in moderate left  hydroureteronephrosis. 2. Additional punctate 1 mm upper pole left renal stone. 3. Incidental note of a duplicated right renal collecting system. Electronically Signed   By: Duanne Guess D.O.   On: 02/04/2021 14:12    Procedures Procedures   Medications Ordered in ED Medications  promethazine (PHENERGAN) 12.5 mg in sodium chloride 0.9 % 50 mL IVPB (0 mg Intravenous Stopped 02/04/21 1500)  potassium chloride SA (KLOR-CON) CR tablet 40 mEq (0 mEq Oral Hold 02/04/21 1524)  tamsulosin (FLOMAX) capsule 0.4 mg (0.4 mg Oral Not Given 02/04/21 1524)  sodium chloride 0.9 % bolus 1,000 mL (0 mLs Intravenous Stopped 02/04/21 1642)  ondansetron (ZOFRAN) injection 4 mg (4 mg Intravenous Given 02/04/21 1310)  ketorolac (TORADOL) 30 MG/ML injection 30 mg (30 mg Intravenous Given 02/04/21 1310)  morphine 4 MG/ML injection 4 mg (4 mg Intravenous Given 02/04/21 1417)  HYDROmorphone (DILAUDID) injection 0.5 mg (0.5 mg Intravenous Given 02/04/21 1526)  metoCLOPramide (REGLAN) injection 5 mg (5 mg Intravenous Given 02/04/21 1526)    ED Course  I have reviewed the triage vital signs and the nursing notes.  Pertinent labs & imaging results that were available during my care of the patient were reviewed by me and considered in my medical decision making (see chart for details).    MDM Rules/Calculators/A&P                          30 year old female presenting the emergency department today for evaluation of left flank pain nausea and vomiting prior to arrival.   Reviewed/interpreted labs CBC with mild leukocytosis, no anemia CMP shows some mild hypokalemia but is otherwise grossly unremarkable  - po k given in ed Lipase is negative UA shows hematuria, ketonuria, proteinuria, greater than 50 RBCs, 21-50 WBCs, rare bacteria and 6-10 squamous epithelial cells.  Unclear if this is just due to contamination versus kidney stone.  Will culture and place patient on antibiotics. Beta-hCG negative  CT abdomen/pelvis shows  ureterolithiasis  Patient was given pain medicine, antiemetics in the ED.  On reassessment she was able to tolerate p.o. and felt much improved.  She is nontoxic, nonseptic appearing.  Feel she is appropriate for discharge with close outpatient follow-up with urology.  Have advised on strict return precautions for any new or worsening symptoms in the meantime.  She voices understanding of  the plan and reasons to return.  All questions answered.  Patient stable for discharge.  Final Clinical Impression(s) / ED Diagnoses Final diagnoses:  Ureteral stone    Rx / DC Orders ED Discharge Orders          Ordered    HYDROcodone-acetaminophen (NORCO/VICODIN) 5-325 MG tablet  Every 6 hours PRN        02/04/21 1712    tamsulosin (FLOMAX) 0.4 MG CAPS capsule  Daily        02/04/21 1712    ondansetron (ZOFRAN) 4 MG tablet  Every 6 hours        02/04/21 1712    sulfamethoxazole-trimethoprim (BACTRIM DS) 800-160 MG tablet  2 times daily        02/04/21 [address]1712             Bruk Tumolo S, PA-C 02/04/21 1712    Arby BarrettePfeiffer, Marcy, MD 02/09/21 2127

## 2021-02-06 LAB — URINE CULTURE

## 2021-02-10 ENCOUNTER — Other Ambulatory Visit: Payer: Self-pay

## 2021-02-10 ENCOUNTER — Ambulatory Visit (INDEPENDENT_AMBULATORY_CARE_PROVIDER_SITE_OTHER): Payer: Medicaid Other | Admitting: Urology

## 2021-02-10 VITALS — BP 127/82 | HR 85 | Ht 64.0 in | Wt 175.0 lb

## 2021-02-10 DIAGNOSIS — N201 Calculus of ureter: Secondary | ICD-10-CM | POA: Diagnosis not present

## 2021-02-10 DIAGNOSIS — N132 Hydronephrosis with renal and ureteral calculous obstruction: Secondary | ICD-10-CM | POA: Diagnosis not present

## 2021-02-10 MED ORDER — MIRABEGRON ER 25 MG PO TB24: 25.0000 mg | ORAL_TABLET | Freq: Every day | ORAL | 0 refills | Status: DC

## 2021-02-10 NOTE — Progress Notes (Signed)
02/10/2021 2:39 PM   Martha Oconnor 1990-10-15 [id number]  Referring provider: No referring provider defined for this encounter.  Chief Complaint  Patient presents with   Nephrolithiasis    HPI: Martha Oconnor is a 30 y.o. female who presents for follow-up of a recent ED visit for renal colic.  Presented to [facility] 02/04/2021 with acute onset of left flank pain described as constant and severe without identifiable precipitating, aggravating or alleviating factors + Nausea, vomiting Denied fever, chills or lower urinary tract symptoms Urinalysis with significant microhematuria.  There is also pyuria however elevated squamous epithelial cells Stone protocol CT remarkable for a 5 mm calculus near the junction of the mid and distal ureter with moderate left hydronephrosis Incidental punctate left renal calculus Symptoms were controlled with parenteral analgesics and she was discharged on hydrocodone, tamsulosin and Septra DS Urine culture grew multiple species Since her ED visit her pain has significant.  However she is now having urinary frequency, urgency and voiding small amounts She has not passed a stone No prior history stone disease   PMH: Past Medical History:  Diagnosis Date   ADHD    Anxiety    Depression    PTSD (post-traumatic stress disorder)     Surgical History: Past Surgical History:  Procedure Laterality Date   TUBAL LIGATION      Home Medications:  Allergies as of 02/10/2021       Reactions   Ceclor [cefaclor] Other (See Comments)   Unknown (rxn as young child)        Medication List        Accurate as of February 10, 2021  2:39 PM. If you have any questions, ask your nurse or doctor.          albuterol 108 (90 Base) MCG/ACT inhaler Commonly known as: VENTOLIN HFA Inhale 2 puffs into the lungs every 4 (four) hours as needed for wheezing or shortness of breath.   azithromycin 250 MG tablet Commonly known as: Zithromax Z-Pak Take 2  tablets (500 mg) on  Day 1,  followed by 1 tablet (250 mg) once daily on Days 2 through 5.   brompheniramine-pseudoephedrine-DM 30-2-10 MG/5ML syrup Take 10 mLs by mouth 4 (four) times daily as needed.   cyclobenzaprine 10 MG tablet Commonly known as: Flexeril Take 1 tablet (10 mg total) by mouth every 8 (eight) hours as needed for muscle spasms.   HYDROcodone-acetaminophen 5-325 MG tablet Commonly known as: NORCO/VICODIN Take 1 tablet by mouth every 6 (six) hours as needed.   ibuprofen 800 MG tablet Commonly known as: ADVIL Take 1 tablet (800 mg total) by mouth every 8 (eight) hours as needed.   ondansetron 4 MG tablet Commonly known as: ZOFRAN Take 1 tablet (4 mg total) by mouth every 6 (six) hours.   predniSONE 10 MG tablet Commonly known as: DELTASONE Take 1 tablet (10 mg total) by mouth as directed.   tamsulosin 0.4 MG Caps capsule Commonly known as: FLOMAX Take 1 capsule (0.4 mg total) by mouth daily.   Vyvanse 30 MG capsule Generic drug: lisdexamfetamine Take 30 mg by mouth every morning.        Allergies:  Allergies  Allergen Reactions   Ceclor [Cefaclor] Other (See Comments)    Unknown (rxn as young child)    Family History: No family history on file.  Social History:  reports that she quit smoking about 7 years ago. Her smoking use included cigarettes. She has never used smokeless tobacco. She  reports previous alcohol use. She reports previous drug use. Drug: Marijuana.   Physical Exam: BP 127/82   Pulse 85   Ht 5\' 4"  (1.626 m)   Wt 175 lb (79.4 kg)   LMP 02/04/2021   BMI 30.04 kg/m   Constitutional:  Alert and oriented, No acute distress. HEENT: Ste[place] AT, moist mucus membranes.  Trachea midline, no masses. Cardiovascular: No clubbing, cyanosis, or edema. Respiratory: Normal respiratory effort, no increased work of breathing. Lymph: No cervical or inguinal lymphadenopathy. Skin: No rashes, bruises or suspicious lesions. Neurologic: Grossly  intact, no focal deficits, moving all 4 extremities. Psychiatric: Normal mood and affect.  Laboratory Data:  Urinalysis Dipstick/microscopy negative   Pertinent Imaging: CT images were personally reviewed and interpreted  CT Renal Stone Study  Narrative CLINICAL DATA:  Left flank pain  EXAM: CT ABDOMEN AND PELVIS WITHOUT CONTRAST  TECHNIQUE: Multidetector CT imaging of the abdomen and pelvis was performed following the standard protocol without IV contrast.  COMPARISON:  None.  FINDINGS: Lower chest: Included lung bases are clear.  Heart size is normal.  Hepatobiliary: Unremarkable unenhanced appearance of the liver. No focal liver lesion identified. Gallbladder within normal limits. No hyperdense gallstone. No biliary dilatation.  Pancreas: Unremarkable. No pancreatic ductal dilatation or surrounding inflammatory changes.  Spleen: Normal in size without focal abnormality.  Adrenals/Urinary Tract: Unremarkable adrenal glands. Obstructing 5.5 mm stone within the distal left ureter along the left pelvic sidewall resulting in moderate left hydroureteronephrosis. Additional punctate 1 mm upper pole left renal stone. Duplicated right renal collecting system. No right-sided renal or ureteral calculi. No right hydronephrosis.  Stomach/Bowel: Stomach is within normal limits. Appendix appears normal (series 2, image 64). No evidence of bowel wall thickening, distention, or inflammatory changes.  Vascular/Lymphatic: No significant vascular findings are evident on unenhanced CT. No enlarged abdominal or pelvic lymph nodes.  Reproductive: Uterus and bilateral adnexa are unremarkable.  Other: No free fluid. No abdominopelvic fluid collection. No pneumoperitoneum. No abdominal wall hernia.  Musculoskeletal: No acute or significant osseous findings.  IMPRESSION: 1. Obstructing 5.5 mm stone within the distal left ureter along the left pelvic sidewall resulting in moderate  left hydroureteronephrosis. 2. Additional punctate 1 mm upper pole left renal stone. 3. Incidental note of a duplicated right renal collecting system.   Electronically Signed By: 04/06/2021 D.O. On: 02/04/2021 14:12   Assessment & Plan:    1.  Left ureteral calculus Her pain has significant provement however she is having bothersome frequency, urgency and voiding small amounts consistent with progression of the stone into the intramural portion of the distal ureter We discussed various treatment options for urolithiasis including observation with or without medical expulsive therapy, shockwave lithotripsy (SWL), ureteroscopy and laser lithotripsy with stent placement. We discussed that management is based on stone size, location, density, patient co-morbidities, and patient preference.  Stones <46mm in size have a >80% spontaneous passage rate. Data surrounding the use of tamsulosin for medical expulsive therapy is controversial, but meta analyses suggests it is most efficacious for distal stones between 5-17mm in size. Possible side effects include dizziness/lightheadedness. SWL has a lower stone free rate in a single procedure, but also a lower complication rate compared to ureteroscopy and avoids a stent and associated stent related symptoms. Possible complications include renal hematoma, steinstrasse, and need for additional treatment. Ureteroscopy with laser lithotripsy and stent placement has a higher stone free rate than SWL in a single procedure, however increased complication rate including possible infection, ureteral injury, bleeding, and  stent related morbidity. Common stent related symptoms include dysuria, urgency/frequency, and flank pain. After an extensive discussion of the risks and benefits of the above treatment options, the patient would like to proceed with continued medical expulsion therapy since these do not appear to have progressed to the most distal portion of the  ureter. Instructed call if she elects surgical management; will touch base with her in approximately 1 week regarding symptoms She was given samples Myrbetriq for her storage related voiding symptoms   Riki Altes, MD  [facility] [address], [address] [place], [place] [address] [phone]

## 2021-02-11 ENCOUNTER — Encounter: Payer: Self-pay | Admitting: Urology

## 2021-02-12 LAB — MICROSCOPIC EXAMINATION: Bacteria, UA: NONE SEEN

## 2021-02-12 LAB — URINALYSIS, COMPLETE
Bilirubin, UA: NEGATIVE
Glucose, UA: NEGATIVE
Ketones, UA: NEGATIVE
Leukocytes,UA: NEGATIVE
Nitrite, UA: NEGATIVE
Protein,UA: NEGATIVE
RBC, UA: NEGATIVE
Specific Gravity, UA: 1.02 (ref 1.005–1.030)
Urobilinogen, Ur: 0.2 mg/dL (ref 0.2–1.0)
pH, UA: 7 (ref 5.0–7.5)

## 2021-02-22 ENCOUNTER — Telehealth: Payer: Self-pay | Admitting: Urology

## 2021-02-22 DIAGNOSIS — N201 Calculus of ureter: Secondary | ICD-10-CM

## 2021-02-22 NOTE — Telephone Encounter (Signed)
Please check with patient to see if she ever passed her stone

## 2021-02-23 NOTE — Telephone Encounter (Signed)
Order in  and patient will have kub done

## 2021-02-23 NOTE — Addendum Note (Signed)
Addended by: Levada Schilling on: 02/23/2021 04:35 PM   Modules accepted: Orders

## 2021-02-23 NOTE — Telephone Encounter (Signed)
Recommend a KUB and will call with results

## 2021-02-23 NOTE — Telephone Encounter (Signed)
Patient stated she dont think she has passed her stone. Been doing okay

## 2021-02-24 ENCOUNTER — Ambulatory Visit
Admission: RE | Admit: 2021-02-24 | Discharge: 2021-02-24 | Disposition: A | Discharge status: Home / Self Care | Payer: Medicaid Other | Source: Ambulatory Visit | Attending: Urology | Admitting: Urology

## 2021-02-24 ENCOUNTER — Ambulatory Visit
Admission: RE | Admit: 2021-02-24 | Discharge: 2021-02-24 | Disposition: A | Discharge status: Home / Self Care | Payer: Medicaid Other | Attending: Urology | Admitting: Urology

## 2021-02-24 DIAGNOSIS — N201 Calculus of ureter: Secondary | ICD-10-CM | POA: Diagnosis not present

## 2021-02-25 ENCOUNTER — Telehealth: Payer: Self-pay | Admitting: Urology

## 2021-02-25 DIAGNOSIS — N132 Hydronephrosis with renal and ureteral calculous obstruction: Secondary | ICD-10-CM

## 2021-02-25 NOTE — Telephone Encounter (Signed)
Notified patient as instructed, patient pleased °

## 2021-02-25 NOTE — Telephone Encounter (Signed)
Stone not seen on KUB.  Recommend follow-up renal ultrasound to check for persistence or resolution of kidney blockage.  Order was placed.

## 2021-03-15 ENCOUNTER — Ambulatory Visit: Admission: RE | Admit: 2021-03-15 | Payer: Medicaid Other | Source: Ambulatory Visit

## 2021-03-30 ENCOUNTER — Ambulatory Visit (HOSPITAL_COMMUNITY)
Admission: RE | Admit: 2021-03-30 | Discharge: 2021-03-30 | Disposition: A | Discharge status: Home / Self Care | Payer: Medicaid Other | Source: Ambulatory Visit | Attending: Urology | Admitting: Urology

## 2021-03-30 ENCOUNTER — Other Ambulatory Visit: Payer: Self-pay

## 2021-03-30 DIAGNOSIS — N132 Hydronephrosis with renal and ureteral calculous obstruction: Secondary | ICD-10-CM | POA: Insufficient documentation

## 2021-03-31 ENCOUNTER — Encounter: Payer: Self-pay | Admitting: *Deleted

## 2021-05-27 ENCOUNTER — Ambulatory Visit (INDEPENDENT_AMBULATORY_CARE_PROVIDER_SITE_OTHER): Payer: Medicaid Other | Admitting: Internal Medicine

## 2021-05-27 ENCOUNTER — Other Ambulatory Visit: Payer: Self-pay

## 2021-05-27 ENCOUNTER — Encounter (HOSPITAL_COMMUNITY): Payer: Self-pay

## 2021-05-27 ENCOUNTER — Encounter: Payer: Self-pay | Admitting: Internal Medicine

## 2021-05-27 ENCOUNTER — Ambulatory Visit (HOSPITAL_COMMUNITY)
Admission: RE | Admit: 2021-05-27 | Discharge: 2021-05-27 | Disposition: A | Discharge status: Home / Self Care | Payer: Medicaid Other | Source: Ambulatory Visit | Attending: Physician Assistant | Admitting: Physician Assistant

## 2021-05-27 VITALS — BP 119/80 | HR 84 | Ht 64.96 in | Wt 175.6 lb

## 2021-05-27 VITALS — BP 123/91 | HR 77 | Temp 98.3°F | Resp 18

## 2021-05-27 DIAGNOSIS — L729 Follicular cyst of the skin and subcutaneous tissue, unspecified: Secondary | ICD-10-CM | POA: Insufficient documentation

## 2021-05-27 DIAGNOSIS — F4310 Post-traumatic stress disorder, unspecified: Secondary | ICD-10-CM | POA: Insufficient documentation

## 2021-05-27 DIAGNOSIS — F339 Major depressive disorder, recurrent, unspecified: Secondary | ICD-10-CM | POA: Diagnosis not present

## 2021-05-27 DIAGNOSIS — Z87898 Personal history of other specified conditions: Secondary | ICD-10-CM

## 2021-05-27 DIAGNOSIS — L089 Local infection of the skin and subcutaneous tissue, unspecified: Secondary | ICD-10-CM

## 2021-05-27 DIAGNOSIS — F419 Anxiety disorder, unspecified: Secondary | ICD-10-CM

## 2021-05-27 HISTORY — DX: Follicular cyst of the skin and subcutaneous tissue, unspecified: L72.9

## 2021-05-27 MED ORDER — MUPIROCIN 2 % EX OINT: 1.0000 "application " | TOPICAL_OINTMENT | Freq: Every day | CUTANEOUS | 0 refills | Status: DC

## 2021-05-27 MED ORDER — DOXYCYCLINE HYCLATE 100 MG PO CAPS: 100.0000 mg | ORAL_CAPSULE | Freq: Two times a day (BID) | ORAL | 0 refills | Status: DC

## 2021-05-27 NOTE — ED Triage Notes (Signed)
Pt presents with c/o a possible abscess on groin area.    Pt states it is sensitive to touch. States she has applied warm and cold compress, has not had any relief.

## 2021-05-27 NOTE — ED Provider Notes (Signed)
[facility]    CSN: [id number] Arrival date & time: 05/27/21  0843      History   Chief Complaint Chief Complaint  Patient presents with   Abscess    HPI Martha Oconnor is a 30 y.o. female.   Patient presents today with a several month history of nodule in her left groin that has enlarged and drained at different times.  She reports 2 to 3 months ago area was much bigger and drained purulent drainage.  It has reduced in size but continues to swell intermittently varying in size at different times.  Denies any fever, nausea, vomiting, dizziness,.  Denies any recent antibiotic use.  She denies history of recurrent skin infections or MRSA.  Denies history of diabetes or immunosuppression.  She does have a history of cysts and had to have 1 removed from her right lateral breast.  She reports pain is currently rated 3 on a 0-10 pain scale but increases when area is swollen, described as pressure, worse with palpation, no alleviating factors identified.  She has tried cool compresses without improvement of symptoms.   Past Medical History:  Diagnosis Date   ADHD    Anxiety    Depression    PTSD (post-traumatic stress disorder)     Patient Active Problem List   Diagnosis Date Noted   History of bilateral tubal ligation 05/27/2019    Past Surgical History:  Procedure Laterality Date   TUBAL LIGATION      OB History   No obstetric history on file.      Home Medications    Prior to Admission medications   Medication Sig Start Date End Date Taking? Authorizing Provider  doxycycline (VIBRAMYCIN) 100 MG capsule Take 1 capsule (100 mg total) by mouth 2 (two) times daily. 05/27/21  Yes Admire Bunnell, Denny Peon K, PA-C  mupirocin ointment (BACTROBAN) 2 % Apply 1 application topically daily. 05/27/21  Yes Colie Josten K, PA-C  VYVANSE 30 MG capsule Take 30 mg by mouth every morning. 01/12/21  Yes [provider]  cyclobenzaprine (FLEXERIL) 10 MG tablet Take 1 tablet (10 mg  total) by mouth every 8 (eight) hours as needed for muscle spasms. 01/27/15   Beers, Charmayne Sheer, PA-C  HYDROcodone-acetaminophen (NORCO/VICODIN) 5-325 MG tablet Take 1 tablet by mouth every 6 (six) hours as needed. 02/04/21   Couture, Cortni S, PA-C  ibuprofen (ADVIL,MOTRIN) 800 MG tablet Take 1 tablet (800 mg total) by mouth every 8 (eight) hours as needed. 01/27/15   Beers, Charmayne Sheer, PA-C  mirabegron ER (MYRBETRIQ) 25 MG TB24 tablet Take 1 tablet (25 mg total) by mouth daily. 02/10/21   Stoioff, Verna Czech, MD  ondansetron (ZOFRAN) 4 MG tablet Take 1 tablet (4 mg total) by mouth every 6 (six) hours. 02/04/21   Couture, Cortni S, PA-C  predniSONE (DELTASONE) 10 MG tablet Take 1 tablet (10 mg total) by mouth as directed. 08/30/15   Cuthriell, Delorise Royals, PA-C    Family History History reviewed. No pertinent family history.  Social History Social History   Tobacco Use   Smoking status: Former    Years: 8.00    Types: Cigarettes    Quit date: 05/28/2013    Years since quitting: 8.0   Smokeless tobacco: Never  Vaping Use   Vaping Use: Never used  Substance Use Topics   Alcohol use: Yes    Comment: occ.   Drug use: Not Currently    Types: Marijuana     Allergies  Ceclor [cefaclor]   Review of Systems Review of Systems  Constitutional:  Negative for activity change, appetite change, fatigue and fever.  Respiratory:  Negative for cough and shortness of breath.   Cardiovascular:  Negative for chest pain.  Gastrointestinal:  Negative for abdominal pain, diarrhea, nausea and vomiting.  Skin:  Positive for color change. Negative for wound.  Neurological:  Negative for dizziness, light-headedness and headaches.    Physical Exam Triage Vital Signs ED Triage Vitals  Enc Vitals Group     BP 05/27/21 0927 (!) 123/91     Pulse Rate 05/27/21 0927 77     Resp 05/27/21 0927 18     Temp 05/27/21 0927 98.3 F (36.8 C)     Temp Source 05/27/21 0927 Oral     SpO2 05/27/21 0927 99 %     Weight --       Height --      Head Circumference --      Peak Flow --      Pain Score 05/27/21 0923 0     Pain Loc --      Pain Edu? --      Excl. in GC? --    No data found.  Updated Vital Signs BP (!) 123/91 (BP Location: Right Arm)   Pulse 77   Temp 98.3 F (36.8 C) (Oral)   Resp 18   SpO2 99%   Visual Acuity Right Eye Distance:   Left Eye Distance:   Bilateral Distance:    Right Eye Near:   Left Eye Near:    Bilateral Near:     Physical Exam Vitals reviewed.  Constitutional:      General: She is awake. She is not in acute distress.    Appearance: Normal appearance. She is well-developed. She is not ill-appearing.     Comments: Very pleasant female appears stated age in no acute distress  HENT:     Head: Normocephalic and atraumatic.  Cardiovascular:     Rate and Rhythm: Normal rate and regular rhythm.     Heart sounds: Normal heart sounds, S1 normal and S2 normal. No murmur heard. Pulmonary:     Effort: Pulmonary effort is normal.     Breath sounds: Normal breath sounds. No wheezing, rhonchi or rales.     Comments: Clear to auscultation bilaterally  Abdominal:     Palpations: Abdomen is soft.     Tenderness: There is no abdominal tenderness.  Skin:    Findings: No erythema.          Comments: 3 cm x 1 cm nodule with well defined borders with central induration with no fluctuance. No erythema or streaking. No bleeding or drainage noted.    Psychiatric:        Behavior: Behavior is cooperative.     UC Treatments / Results  Labs (all labs ordered are listed, but only abnormal results are displayed) Labs Reviewed - No data to display  EKG   Radiology No results found.  Procedures Procedures (including critical care time)  Medications Ordered in UC Medications - No data to display  Initial Impression / Assessment and Plan / UC Course  I have reviewed the triage vital signs and the nursing notes.  Pertinent labs & imaging results that were available  during my care of the patient were reviewed by me and considered in my medical decision making (see chart for details).      No evidence of drainable abscess on exam that would lend  itself to I&D.  Discussed that symptoms are more consistent with an infected cyst given well-defined borders without fluctuance.  Patient was started on doxycycline 100 mg twice daily for 10 days with instruction to avoid prolonged sun exposure due to positivity associate with this medication.  She was prescribed Bactroban for any wounds.  Recommend she use over-the-counter medications for symptom relief.  Discussed that she may need to consider seeing a dermatologist for removal and was given contact information for local provider.  Discussed alarm symptoms that warrant emergent evaluation.  Strict return precautions given to which she expressed understanding.  Final Clinical Impressions(s) / UC Diagnoses   Final diagnoses:  Infected cyst of skin     Discharge Instructions      Take doxycycline twice daily for 10 days.  Stay out of the sun while on this medication as it can give you a bad sunburn.  As recommend you take it with food as it can upset your stomach.  You can use Bactroban ointment on any wounds.  Continue with warm compresses.  Follow-up with your primary care provider or consider seeing a dermatologist to have cyst removed.  If you develop any worsening symptoms and have increased swelling/drainage please return for reevaluation.     ED Prescriptions     Medication Sig Dispense Auth. Provider   doxycycline (VIBRAMYCIN) 100 MG capsule Take 1 capsule (100 mg total) by mouth 2 (two) times daily. 20 capsule Keyana Guevara K, PA-C   mupirocin ointment (BACTROBAN) 2 % Apply 1 application topically daily. 22 g Maeci Kalbfleisch K, PA-C      PDMP not reviewed this encounter.   Jeani Hawking, PA-C 05/27/21 1007

## 2021-05-27 NOTE — Discharge Instructions (Signed)
Take doxycycline twice daily for 10 days.  Stay out of the sun while on this medication as it can give you a bad sunburn.  As recommend you take it with food as it can upset your stomach.  You can use Bactroban ointment on any wounds.  Continue with warm compresses.  Follow-up with your primary care provider or consider seeing a dermatologist to have cyst removed.  If you develop any worsening symptoms and have increased swelling/drainage please return for reevaluation.
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
## 2021-06-03 DIAGNOSIS — F333 Major depressive disorder, recurrent, severe with psychotic symptoms: Secondary | ICD-10-CM | POA: Diagnosis not present

## 2021-06-08 DIAGNOSIS — F333 Major depressive disorder, recurrent, severe with psychotic symptoms: Secondary | ICD-10-CM | POA: Diagnosis not present

## 2021-06-15 DIAGNOSIS — F333 Major depressive disorder, recurrent, severe with psychotic symptoms: Secondary | ICD-10-CM | POA: Diagnosis not present

## 2021-06-17 ENCOUNTER — Other Ambulatory Visit (INDEPENDENT_AMBULATORY_CARE_PROVIDER_SITE_OTHER): Payer: Medicaid Other

## 2021-06-17 ENCOUNTER — Other Ambulatory Visit: Payer: Self-pay

## 2021-06-17 DIAGNOSIS — Z136 Encounter for screening for cardiovascular disorders: Secondary | ICD-10-CM

## 2021-06-17 DIAGNOSIS — Z1329 Encounter for screening for other suspected endocrine disorder: Secondary | ICD-10-CM

## 2021-06-17 DIAGNOSIS — L729 Follicular cyst of the skin and subcutaneous tissue, unspecified: Secondary | ICD-10-CM

## 2021-06-17 DIAGNOSIS — F419 Anxiety disorder, unspecified: Secondary | ICD-10-CM

## 2021-06-17 LAB — MICROSCOPIC EXAMINATION: WBC, UA: NONE SEEN /hpf (ref 0–5)

## 2021-06-17 LAB — URINALYSIS, ROUTINE W REFLEX MICROSCOPIC
Bilirubin, UA: NEGATIVE
Glucose, UA: NEGATIVE
Ketones, UA: NEGATIVE
Leukocytes,UA: NEGATIVE
Nitrite, UA: NEGATIVE
Specific Gravity, UA: >1.03 — ABNORMAL HIGH (ref 1.005–1.030)
Urobilinogen, Ur: 0.2 mg/dL (ref 0.2–1.0)
pH, UA: 5.5 (ref 5.0–7.5)

## 2021-06-18 LAB — CBC WITH DIFFERENTIAL/PLATELET
Basophils Absolute: 0.1 10*3/uL (ref 0.0–0.2)
Basos: 1 %
EOS (ABSOLUTE): 0.1 10*3/uL (ref 0.0–0.4)
Eos: 1 %
Hematocrit: 44.1 % (ref 34.0–46.6)
Hemoglobin: 14.6 g/dL (ref 11.1–15.9)
Immature Grans (Abs): 0 10*3/uL (ref 0.0–0.1)
Immature Granulocytes: 0 %
Lymphocytes Absolute: 2.3 10*3/uL (ref 0.7–3.1)
Lymphs: 36 %
MCH: 30.2 pg (ref 26.6–33.0)
MCHC: 33.1 g/dL (ref 31.5–35.7)
MCV: 91 fL (ref 79–97)
Monocytes Absolute: 0.4 10*3/uL (ref 0.1–0.9)
Monocytes: 6 %
Neutrophils Absolute: 3.6 10*3/uL (ref 1.4–7.0)
Neutrophils: 56 %
Platelets: 338 10*3/uL (ref 150–450)
RBC: 4.83 x10E6/uL (ref 3.77–5.28)
RDW: 12 % (ref 11.7–15.4)
WBC: 6.5 10*3/uL (ref 3.4–10.8)

## 2021-06-18 LAB — COMP. METABOLIC PANEL (12)
AST: 16 IU/L (ref 0–40)
Albumin/Globulin Ratio: 1.9 (ref 1.2–2.2)
Albumin: 4.9 g/dL — ABNORMAL HIGH (ref 3.8–4.8)
Alkaline Phosphatase: 73 IU/L (ref 44–121)
BUN/Creatinine Ratio: 16 (ref 9–23)
BUN: 11 mg/dL (ref 6–20)
Bilirubin Total: 0.5 mg/dL (ref 0.0–1.2)
Calcium: 9.7 mg/dL (ref 8.7–10.2)
Chloride: 104 mmol/L (ref 96–106)
Creatinine, Ser: 0.68 mg/dL (ref 0.57–1.00)
Globulin, Total: 2.6 g/dL (ref 1.5–4.5)
Glucose: 82 mg/dL (ref 70–99)
Potassium: 4.1 mmol/L (ref 3.5–5.2)
Sodium: 142 mmol/L (ref 134–144)
Total Protein: 7.5 g/dL (ref 6.0–8.5)
eGFR: 119 mL/min/{1.73_m2} (ref >59)

## 2021-06-18 LAB — LIPID PANEL
Chol/HDL Ratio: 4.9 ratio — ABNORMAL HIGH (ref 0.0–4.4)
Cholesterol, Total: 199 mg/dL (ref 100–199)
HDL: 41 mg/dL (ref >39)
LDL Chol Calc (NIH): 138 mg/dL — ABNORMAL HIGH (ref 0–99)
Triglycerides: 112 mg/dL (ref 0–149)
VLDL Cholesterol Cal: 20 mg/dL (ref 5–40)

## 2021-06-18 LAB — TSH: TSH: 2.07 u[IU]/mL (ref 0.450–4.500)

## 2021-06-24 ENCOUNTER — Encounter: Payer: Medicaid Other | Admitting: Internal Medicine

## 2021-06-30 ENCOUNTER — Encounter: Payer: Medicaid Other | Admitting: Internal Medicine

## 2021-07-01 ENCOUNTER — Ambulatory Visit (INDEPENDENT_AMBULATORY_CARE_PROVIDER_SITE_OTHER): Payer: BC Managed Care – PPO | Admitting: Internal Medicine

## 2021-07-01 ENCOUNTER — Other Ambulatory Visit: Payer: Self-pay

## 2021-07-01 ENCOUNTER — Encounter: Payer: Self-pay | Admitting: Internal Medicine

## 2021-07-01 VITALS — BP 140/95 | HR 101 | Temp 98.7°F | Ht 64.96 in | Wt 170.2 lb

## 2021-07-01 DIAGNOSIS — R030 Elevated blood-pressure reading, without diagnosis of hypertension: Secondary | ICD-10-CM

## 2021-07-01 DIAGNOSIS — R809 Proteinuria, unspecified: Secondary | ICD-10-CM

## 2021-07-01 DIAGNOSIS — Z0000 Encounter for general adult medical examination without abnormal findings: Secondary | ICD-10-CM | POA: Diagnosis not present

## 2021-07-01 LAB — URINALYSIS, ROUTINE W REFLEX MICROSCOPIC
Bilirubin, UA: NEGATIVE
Glucose, UA: NEGATIVE
Ketones, UA: NEGATIVE
Leukocytes,UA: NEGATIVE
Nitrite, UA: NEGATIVE
Protein,UA: NEGATIVE
RBC, UA: NEGATIVE
Specific Gravity, UA: >1.03 — ABNORMAL HIGH (ref 1.005–1.030)
Urobilinogen, Ur: 0.2 mg/dL (ref 0.2–1.0)
pH, UA: 5.5 (ref 5.0–7.5)

## 2021-07-01 NOTE — Progress Notes (Signed)
BP (!) 140/95   Pulse (!) 101   Temp 98.7 F (37.1 C) (Oral)   Ht 5' 4.96" (1.65 m)   Wt 170 lb 3.2 oz (77.2 kg)   SpO2 100%   BMI 28.36 kg/m    Subjective:    Patient ID: Martha Oconnor, female    DOB: 1990-09-07, 30 y.o.   MRN: [id number]  Chief Complaint  Patient presents with   Annual Exam    HPI: Martha Oconnor is a 30 y.o. female  Here for a phsyical. She has a headache everyday  Hypertension This is a chronic (pt had preeclampsia with her last pregnancy sh ewas 15 at the time and was told she had htn.) problem. The problem is uncontrolled. Associated symptoms include headaches. Pertinent negatives include no anxiety, blurred vision, chest pain, malaise/fatigue, neck pain, orthopnea, palpitations, peripheral edema, PND, shortness of breath or sweats.   Chief Complaint  Patient presents with   Annual Exam    Relevant past medical, surgical, family and social history reviewed and updated as indicated. Interim medical history since our last visit reviewed. Allergies and medications reviewed and updated.  Review of Systems  Constitutional:  Negative for malaise/fatigue.  Eyes:  Negative for blurred vision.  Respiratory:  Negative for shortness of breath.   Cardiovascular:  Negative for chest pain, palpitations, orthopnea and PND.  Musculoskeletal:  Negative for neck pain.  Neurological:  Positive for headaches.   Per HPI unless specifically indicated above     Objective:    BP (!) 140/95   Pulse (!) 101   Temp 98.7 F (37.1 C) (Oral)   Ht 5' 4.96" (1.65 m)   Wt 170 lb 3.2 oz (77.2 kg)   SpO2 100%   BMI 28.36 kg/m   Wt Readings from Last 3 Encounters:  07/01/21 170 lb 3.2 oz (77.2 kg)  05/27/21 175 lb 9.6 oz (79.7 kg)  02/10/21 175 lb (79.4 kg)    Physical Exam Vitals and nursing note reviewed.  Constitutional:      General: She is not in acute distress.    Appearance: Normal appearance. She is not ill-appearing or diaphoretic.   HENT:     Head: Normocephalic and atraumatic.     Right Ear: Tympanic membrane and external ear normal. There is no impacted cerumen.     Left Ear: External ear normal.     Nose: No congestion or rhinorrhea.     Mouth/Throat:     Pharynx: No oropharyngeal exudate or posterior oropharyngeal erythema.  Eyes:     Conjunctiva/sclera: Conjunctivae normal.     Pupils: Pupils are equal, round, and reactive to light.  Cardiovascular:     Rate and Rhythm: Normal rate and regular rhythm.     Heart sounds: No murmur heard.   No friction rub. No gallop.  Pulmonary:     Effort: No respiratory distress.     Breath sounds: No stridor. No wheezing or rhonchi.  Chest:     Chest wall: No tenderness.  Abdominal:     General: Abdomen is flat. Bowel sounds are normal. There is no distension.     Palpations: Abdomen is soft. There is no mass.     Tenderness: There is no abdominal tenderness. There is no guarding.  Musculoskeletal:        General: No swelling or deformity.     Cervical back: Normal range of motion and neck supple. No rigidity or tenderness.     Right lower leg: No  edema.     Left lower leg: No edema.  Skin:    General: Skin is warm and dry.     Coloration: Skin is not jaundiced.     Findings: No erythema.  Neurological:     Mental Status: She is alert and oriented to person, place, and time. Mental status is at baseline.  Psychiatric:        Mood and Affect: Mood normal.        Behavior: Behavior normal.        Thought Content: Thought content normal.        Judgment: Judgment normal.    Results for orders placed or performed in visit on 06/17/21  Microscopic Examination   Urine  Result Value Ref Range   WBC, UA None seen 0 - 5 /hpf   RBC 0-2 0 - 2 /hpf   Epithelial Cells (non renal) 0-10 0 - 10 /hpf   Mucus, UA Present (A) Not Estab.   Bacteria, UA Moderate (A) None seen/Few  CBC with Differential/Platelet  Result Value Ref Range   WBC 6.5 3.4 - 10.8 x10E3/uL   RBC  4.83 3.77 - 5.28 x10E6/uL   Hemoglobin 14.6 11.1 - 15.9 g/dL   Hematocrit 44.1 34.0 - 46.6 %   MCV 91 79 - 97 fL   MCH 30.2 26.6 - 33.0 pg   MCHC 33.1 31.5 - 35.7 g/dL   RDW 12.0 11.7 - 15.4 %   Platelets 338 150 - 450 x10E3/uL   Neutrophils 56 Not Estab. %   Lymphs 36 Not Estab. %   Monocytes 6 Not Estab. %   Eos 1 Not Estab. %   Basos 1 Not Estab. %   Neutrophils Absolute 3.6 1.4 - 7.0 x10E3/uL   Lymphocytes Absolute 2.3 0.7 - 3.1 x10E3/uL   Monocytes Absolute 0.4 0.1 - 0.9 x10E3/uL   EOS (ABSOLUTE) 0.1 0.0 - 0.4 x10E3/uL   Basophils Absolute 0.1 0.0 - 0.2 x10E3/uL   Immature Granulocytes 0 Not Estab. %   Immature Grans (Abs) 0.0 0.0 - 0.1 x10E3/uL  Comp. Metabolic Panel (12)  Result Value Ref Range   Glucose 82 70 - 99 mg/dL   BUN 11 6 - 20 mg/dL   Creatinine, Ser 0.68 0.57 - 1.00 mg/dL   eGFR 119 >59 mL/min/1.73   BUN/Creatinine Ratio 16 9 - 23   Sodium 142 134 - 144 mmol/L   Potassium 4.1 3.5 - 5.2 mmol/L   Chloride 104 96 - 106 mmol/L   Calcium 9.7 8.7 - 10.2 mg/dL   Total Protein 7.5 6.0 - 8.5 g/dL   Albumin 4.9 (H) 3.8 - 4.8 g/dL   Globulin, Total 2.6 1.5 - 4.5 g/dL   Albumin/Globulin Ratio 1.9 1.2 - 2.2   Bilirubin Total 0.5 0.0 - 1.2 mg/dL   Alkaline Phosphatase 73 44 - 121 IU/L   AST 16 0 - 40 IU/L  Lipid panel  Result Value Ref Range   Cholesterol, Total 199 100 - 199 mg/dL   Triglycerides 112 0 - 149 mg/dL   HDL 41 >39 mg/dL   VLDL Cholesterol Cal 20 5 - 40 mg/dL   LDL Chol Calc (NIH) 138 (H) 0 - 99 mg/dL   Chol/HDL Ratio 4.9 (H) 0.0 - 4.4 ratio  TSH  Result Value Ref Range   TSH 2.070 0.450 - 4.500 uIU/mL  Urinalysis, Routine w reflex microscopic  Result Value Ref Range   Specific Gravity, UA >1.030 (H) 1.005 - 1.030   pH, UA  5.5 5.0 - 7.5   Color, UA Yellow Yellow   Appearance Ur Clear Clear   Leukocytes,UA Negative Negative   Protein,UA Trace (A) Negative/Trace   Glucose, UA Negative Negative   Ketones, UA Negative Negative   RBC, UA Trace  (A) Negative   Bilirubin, UA Negative Negative   Urobilinogen, Ur 0.2 0.2 - 1.0 mg/dL   Nitrite, UA Negative Negative   Microscopic Examination See below:         Current Outpatient Medications:    VYVANSE 40 MG capsule, Take 40 mg by mouth every morning., Disp: , Rfl:     Assessment & Plan:  PHYSICAL :  Physical Wnl will check CMP, FLP, CBC,TSH, PSA.   Prehypertension bp improved on recheckin.  Lifestyle modifications advised to pt. Low salt diet advised   Portion control and avoiding high carb low fat diet advised.  Diet plan given to pt   exercise plan given and encouraged.  To increase exercise to 150 mins a week ie 21/2 hours a week. Pt verbalises understanding of the above.   3. Trace proteinuria will recheck UA ? Sec to elevated bp.   4. ADD Continue current meds  Problem List Items Addressed This Visit   None Visit Diagnoses     Proteinuria, unspecified type    -  Primary   Relevant Orders   Urinalysis, Routine w reflex microscopic   Annual physical exam       Prehypertension            Orders Placed This Encounter  Procedures   Urinalysis, Routine w reflex microscopic     No orders of the defined types were placed in this encounter.    Follow up plan: No follow-ups on file.

## 2021-07-08 DIAGNOSIS — F333 Major depressive disorder, recurrent, severe with psychotic symptoms: Secondary | ICD-10-CM | POA: Diagnosis not present

## 2021-07-13 DIAGNOSIS — M5416 Radiculopathy, lumbar region: Secondary | ICD-10-CM | POA: Diagnosis not present

## 2021-07-13 DIAGNOSIS — M5136 Other intervertebral disc degeneration, lumbar region: Secondary | ICD-10-CM | POA: Diagnosis not present

## 2021-07-13 DIAGNOSIS — M461 Sacroiliitis, not elsewhere classified: Secondary | ICD-10-CM | POA: Diagnosis not present

## 2021-07-13 DIAGNOSIS — M5442 Lumbago with sciatica, left side: Secondary | ICD-10-CM | POA: Diagnosis not present

## 2021-07-14 ENCOUNTER — Other Ambulatory Visit: Payer: Self-pay | Admitting: Physical Medicine and Rehabilitation

## 2021-07-14 DIAGNOSIS — M5416 Radiculopathy, lumbar region: Secondary | ICD-10-CM

## 2021-07-14 DIAGNOSIS — F333 Major depressive disorder, recurrent, severe with psychotic symptoms: Secondary | ICD-10-CM | POA: Diagnosis not present

## 2021-07-15 ENCOUNTER — Other Ambulatory Visit: Payer: Self-pay

## 2021-07-15 ENCOUNTER — Ambulatory Visit
Admission: RE | Admit: 2021-07-15 | Discharge: 2021-07-15 | Disposition: A | Discharge status: Home / Self Care | Payer: BC Managed Care – PPO | Source: Ambulatory Visit | Attending: Physical Medicine and Rehabilitation | Admitting: Physical Medicine and Rehabilitation

## 2021-07-15 DIAGNOSIS — M79605 Pain in left leg: Secondary | ICD-10-CM | POA: Diagnosis not present

## 2021-07-15 DIAGNOSIS — M5416 Radiculopathy, lumbar region: Secondary | ICD-10-CM

## 2021-07-15 DIAGNOSIS — M545 Low back pain, unspecified: Secondary | ICD-10-CM | POA: Diagnosis not present

## 2021-07-19 DIAGNOSIS — M5416 Radiculopathy, lumbar region: Secondary | ICD-10-CM | POA: Diagnosis not present

## 2021-07-27 DIAGNOSIS — F333 Major depressive disorder, recurrent, severe with psychotic symptoms: Secondary | ICD-10-CM | POA: Diagnosis not present

## 2021-08-03 ENCOUNTER — Ambulatory Visit: Payer: Self-pay

## 2021-08-03 NOTE — Telephone Encounter (Signed)
Pt c/o productive cough w/ green and yellow phlegm. Pr having severe coughing episodes that make her vomit after. She has a poor appetite and sore throat. Complains of head congestion, post nasal drip. Pt c/o cold chills and hot flashes. Pt c/o mild dizziness that occurs intermittently. Care advise given and pt verbalized understanding.  Appt made with PCP Wednesday.       Reason for Disposition  SEVERE coughing spells (e.g., whooping sound after coughing, vomiting after coughing)  Answer Assessment - Initial Assessment Questions 1. ONSET: "When did the cough begin?"      Monday 2. SEVERITY: "How bad is the cough today?"      Coughing and post tussive emesis 3. SPUTUM: "Describe the color of your sputum" (none, dry cough; clear, white, yellow, green)     Brownish green 4. HEMOPTYSIS: "Are you coughing up any blood?" If so ask: "How much?" (flecks, streaks, tablespoons, etc.)     no 5. DIFFICULTY BREATHING: "Are you having difficulty breathing?" If Yes, ask: "How bad is it?" (e.g., mild, moderate, severe)    - MILD: No SOB at rest, mild SOB with walking, speaks normally in sentences, can lie down, no retractions, pulse < 100.    - MODERATE: SOB at rest, SOB with minimal exertion and prefers to sit, cannot lie down flat, speaks in phrases, mild retractions, audible wheezing, pulse 100-120.    - SEVERE: Very SOB at rest, speaks in single words, struggling to breathe, sitting hunched forward, retractions, pulse > 120      mild 6. FEVER: "Do you have a fever?" If Yes, ask: "What is your temperature, how was it measured, and when did it start?"     Hot flashes and chills 7. CARDIAC HISTORY: "Do you have any history of heart disease?" (e.g., heart attack, congestive heart failure)      no 8. LUNG HISTORY: "Do you have any history of lung disease?"  (e.g., pulmonary embolus, asthma, emphysema)     no 9. PE RISK FACTORS: "Do you have a history of blood clots?" (or: recent major surgery, recent  prolonged travel, bedridden)     no 10. OTHER SYMPTOMS: "Do you have any other symptoms?" (e.g., runny nose, wheezing, chest pain)       Head congestion nasal congestion and dizziness, post nasal drip, poor appetite 11. PREGNANCY: "Is there any chance you are pregnant?" "When was your last menstrual period?"       Menses now tubes tied 12. TRAVEL: "Have you traveled out of the country in the last month?" (e.g., travel history, exposures)       no  Protocols used: Cough - Acute Productive-A-AH

## 2021-08-04 ENCOUNTER — Encounter: Payer: Self-pay | Admitting: Nurse Practitioner

## 2021-08-04 ENCOUNTER — Ambulatory Visit (INDEPENDENT_AMBULATORY_CARE_PROVIDER_SITE_OTHER): Payer: BC Managed Care – PPO | Admitting: Nurse Practitioner

## 2021-08-04 ENCOUNTER — Other Ambulatory Visit: Payer: Self-pay

## 2021-08-04 ENCOUNTER — Ambulatory Visit: Payer: BC Managed Care – PPO | Admitting: Internal Medicine

## 2021-08-04 VITALS — BP 122/89 | HR 108 | Temp 99.1°F

## 2021-08-04 DIAGNOSIS — R509 Fever, unspecified: Secondary | ICD-10-CM | POA: Diagnosis not present

## 2021-08-04 LAB — VERITOR FLU A/B WAIVED
Influenza A: NEGATIVE
Influenza B: NEGATIVE

## 2021-08-04 MED ORDER — OSELTAMIVIR PHOSPHATE 75 MG PO CAPS: 75.0000 mg | ORAL_CAPSULE | Freq: Two times a day (BID) | ORAL | 0 refills | Status: AC

## 2021-08-04 NOTE — Assessment & Plan Note (Signed)
Acute since Sunday, exposure to boss with flu.  At this time flu testing returned negative, have sent Covid testing.  Due to exposure and symptoms at this time will send in Tamiflu, as concerned for flu-like symptoms.  Recommend: - Increased rest - Increasing Fluids - Acetaminophen / ibuprofen as needed for fever/pain.  - Salt water gargling, chloraseptic spray and throat lozenges - Mucinex.  Return to office as needed for worsening symptoms.  Work note provided.

## 2021-08-04 NOTE — Progress Notes (Signed)
BP 122/89   Pulse (!) 108   Temp 99.1 F (37.3 C)   SpO2 98%    Subjective:    Patient ID: Martha Oconnor, female    DOB: 1990-10-04, 30 y.o.   MRN: [id number]  HPI: Martha Oconnor is a 30 y.o. female  Chief Complaint  Patient presents with   Flu Exposure    Patient states she started to feel bad over the weekend. Patient states her boss at her job tested positive for Flu. Patient states she has had fever/chills, decreased appetite, discolored phlegm. Patient states she was taking Mucinex DM and was told to stop taking the OTC. Patient states feels she is getting worse.    UPPER RESPIRATORY TRACT INFECTION Reports feeling bad over weekend, had exposure to her boss who diagnosed with flu -- [employer] in [place].  Initial symptoms cough and sore throat.  Has not had Covid or Flu vaccinations. Fever: yes Cough: yes Shortness of breath: yes Wheezing: no Chest pain: no Chest tightness: yes Chest congestion: yes Nasal congestion: yes Runny nose: yes Post nasal drip: yes Sneezing: no Sore throat: yes Swollen glands: no Sinus pressure: no Headache: yes Face pain: no Toothache: no Ear pain: none Ear pressure: none Eyes red/itching:no Eye drainage/crusting: no  Vomiting: no Rash: no Fatigue: yes Sick contacts: yes Strep contacts: no  Context: stable Recurrent sinusitis: no Relief with OTC cold/cough medications: Motrin helps Treatments attempted: mucinex    Relevant past medical, surgical, family and social history reviewed and updated as indicated. Interim medical history since our last visit reviewed. Allergies and medications reviewed and updated.  Review of Systems  Constitutional:  Positive for chills, fatigue and fever. Negative for activity change and appetite change.  HENT:  Positive for congestion, postnasal drip, rhinorrhea, sinus pressure and sore throat. Negative for ear discharge, ear pain, facial swelling, sinus pain, sneezing and  voice change.   Eyes:  Negative for pain and visual disturbance.  Respiratory:  Positive for cough and chest tightness. Negative for shortness of breath and wheezing.   Cardiovascular:  Negative for chest pain, palpitations and leg swelling.  Gastrointestinal: Negative.   Endocrine: Negative.   Musculoskeletal:  Positive for myalgias.  Neurological:  Positive for headaches. Negative for dizziness and numbness.  Psychiatric/Behavioral: Negative.     Per HPI unless specifically indicated above     Objective:    BP 122/89   Pulse (!) 108   Temp 99.1 F (37.3 C)   SpO2 98%   Wt Readings from Last 3 Encounters:  07/01/21 170 lb 3.2 oz (77.2 kg)  05/27/21 175 lb 9.6 oz (79.7 kg)  02/10/21 175 lb (79.4 kg)    Physical Exam Vitals and nursing note reviewed.  Constitutional:      General: She is awake. She is not in acute distress.    Appearance: She is well-developed and well-groomed. She is not ill-appearing or toxic-appearing.  HENT:     Head: Normocephalic.     Right Ear: Hearing, ear canal and external ear normal. A middle ear effusion is present.     Left Ear: Hearing, ear canal and external ear normal. A middle ear effusion is present.     Nose: Rhinorrhea present. Rhinorrhea is clear.     Right Sinus: No maxillary sinus tenderness or frontal sinus tenderness.     Left Sinus: No maxillary sinus tenderness or frontal sinus tenderness.     Mouth/Throat:     Mouth: Mucous membranes are moist.  Pharynx: Posterior oropharyngeal erythema (mild with cobblestoning) present. No pharyngeal swelling or oropharyngeal exudate.  Eyes:     General: Lids are normal.        Right eye: No discharge.        Left eye: No discharge.     Conjunctiva/sclera: Conjunctivae normal.     Pupils: Pupils are equal, round, and reactive to light.  Neck:     Thyroid: No thyromegaly.     Vascular: No carotid bruit or JVD.  Cardiovascular:     Rate and Rhythm: Normal rate and regular rhythm.      Heart sounds: Normal heart sounds. No murmur heard.   No gallop.  Pulmonary:     Effort: Pulmonary effort is normal. No accessory muscle usage or respiratory distress.     Breath sounds: Normal breath sounds.  Abdominal:     General: Bowel sounds are normal.     Palpations: Abdomen is soft. There is no hepatomegaly or splenomegaly.  Musculoskeletal:     Cervical back: Normal range of motion and neck supple.     Right lower leg: No edema.     Left lower leg: No edema.  Lymphadenopathy:     Cervical: No cervical adenopathy.  Skin:    General: Skin is warm and dry.  Neurological:     Mental Status: She is alert and oriented to person, place, and time.  Psychiatric:        Attention and Perception: Attention normal.        Mood and Affect: Mood normal.        Speech: Speech normal.        Behavior: Behavior normal. Behavior is cooperative.        Thought Content: Thought content normal.    Results for orders placed or performed in visit on 07/01/21  Urinalysis, Routine w reflex microscopic  Result Value Ref Range   Specific Gravity, UA >1.030 (H) 1.005 - 1.030   pH, UA 5.5 5.0 - 7.5   Color, UA Yellow Yellow   Appearance Ur Cloudy (A) Clear   Leukocytes,UA Negative Negative   Protein,UA Negative Negative/Trace   Glucose, UA Negative Negative   Ketones, UA Negative Negative   RBC, UA Negative Negative   Bilirubin, UA Negative Negative   Urobilinogen, Ur 0.2 0.2 - 1.0 mg/dL   Nitrite, UA Negative Negative      Assessment & Plan:   Problem List Items Addressed This Visit       Other   Fever - Primary    Acute since Sunday, exposure to boss with flu.  At this time flu testing returned negative, have sent Covid testing.  Due to exposure and symptoms at this time will send in Tamiflu, as concerned for flu-like symptoms.  Recommend: - Increased rest - Increasing Fluids - Acetaminophen / ibuprofen as needed for fever/pain.  - Salt water gargling, chloraseptic spray and  throat lozenges - Mucinex.  Return to office as needed for worsening symptoms.  Work note provided.      Relevant Orders   Veritor Flu A/B Waived   Novel Coronavirus, NAA ([facility])     Follow up plan: Return if symptoms worsen or fail to improve.

## 2021-08-04 NOTE — Patient Instructions (Signed)
Influenza, Adult °Influenza is also called "the flu." It is an infection in the lungs, nose, and throat (respiratory tract). It spreads easily from person to person (is contagious). The flu causes symptoms that are like a cold, along with high fever and body aches. °What are the causes? °This condition is caused by the influenza virus. You can get the virus by: °Breathing in droplets that are in the air after a person infected with the flu coughed or sneezed. °Touching something that has the virus on it and then touching your mouth, nose, or eyes. °What increases the risk? °Certain things may make you more likely to get the flu. These include: °Not washing your hands often. °Having close contact with many people during cold and flu season. °Touching your mouth, eyes, or nose without first washing your hands. °Not getting a flu shot every year. °You may have a higher risk for the flu, and serious problems, such as a lung infection (pneumonia), if you: °Are older than 65. °Are pregnant. °Have a weakened disease-fighting system (immune system) because of a disease or because you are taking certain medicines. °Have a long-term (chronic) condition, such as: °Heart, kidney, or lung disease. °Diabetes. °Asthma. °Have a liver disorder. °Are very overweight (morbidly obese). °Have anemia. °What are the signs or symptoms? °Symptoms usually begin suddenly and last 4-14 days. They may include: °Fever and chills. °Headaches, body aches, or muscle aches. °Sore throat. °Cough. °Runny or stuffy (congested) nose. °Feeling discomfort in your chest. °Not wanting to eat as much as normal. °Feeling weak or tired. °Feeling dizzy. °Feeling sick to your stomach or throwing up. °How is this treated? °If the flu is found early, you can be treated with antiviral medicine. This can help to reduce how bad the illness is and how long it lasts. This may be given by mouth or through an IV tube. °Taking care of yourself at home can help your  symptoms get better. Your doctor may want you to: °Take over-the-counter medicines. °Drink plenty of fluids. °The flu often goes away on its own. If you have very bad symptoms or other problems, you may be treated in a hospital. °Follow these instructions at home: °  °Activity °Rest as needed. Get plenty of sleep. °Stay home from work or school as told by your doctor. °Do not leave home until you do not have a fever for 24 hours without taking medicine. °Leave home only to go to your doctor. °Eating and drinking °Take an ORS (oral rehydration solution). This is a drink that is sold at pharmacies and stores. °Drink enough fluid to keep your pee pale yellow. °Drink clear fluids in small amounts as you are able. Clear fluids include: °Water. °Ice chips. °Fruit juice mixed with water. °Low-calorie sports drinks. °Eat bland foods that are easy to digest. Eat small amounts as you are able. These foods include: °Bananas. °Applesauce. °Rice. °Lean meats. °Toast. °Crackers. °Do not eat or drink: °Fluids that have a lot of sugar or caffeine. °Alcohol. °Spicy or fatty foods. °General instructions °Take over-the-counter and prescription medicines only as told by your doctor. °Use a cool mist humidifier to add moisture to the air in your home. This can make it easier for you to breathe. °When using a cool mist humidifier, clean it daily. Empty water and replace with clean water. °Cover your mouth and nose when you cough or sneeze. °Wash your hands with soap and water often and for at least 20 seconds. This is also important after   you cough or sneeze. If you cannot use soap and water, use alcohol-based hand sanitizer. °Keep all follow-up visits. °How is this prevented? ° °Get a flu shot every year. You may get the flu shot in late summer, fall, or winter. Ask your doctor when you should get your flu shot. °Avoid contact with people who are sick during fall and winter. This is cold and flu season. °Contact a doctor if: °You get  new symptoms. °You have: °Chest pain. °Watery poop (diarrhea). °A fever. °Your cough gets worse. °You start to have more mucus. °You feel sick to your stomach. °You throw up. °Get help right away if you: °Have shortness of breath. °Have trouble breathing. °Have skin or nails that turn a bluish color. °Have very bad pain or stiffness in your neck. °Get a sudden headache. °Get sudden pain in your face or ear. °Cannot eat or drink without throwing up. °These symptoms may represent a serious problem that is an emergency. Get medical help right away. Call your local emergency services (911 in the [place] °Do not wait to see if the symptoms will go away. °Do not drive yourself to the hospital. °Summary °Influenza is also called "the flu." It is an infection in the lungs, nose, and throat. It spreads easily from person to person. °Take over-the-counter and prescription medicines only as told by your doctor. °Getting a flu shot every year is the best way to not get the flu. °This information is not intended to replace advice given to you by your health care provider. Make sure you discuss any questions you have with your health care provider. °Document Revised: 04/03/2020 Document Reviewed: 04/03/2020 °Elsevier Patient Education © 2022 [employer] ° °

## 2021-08-05 ENCOUNTER — Ambulatory Visit: Payer: Medicaid Other | Admitting: Internal Medicine

## 2021-08-05 LAB — NOVEL CORONAVIRUS, NAA: SARS-CoV-2, NAA: NOT DETECTED

## 2021-08-05 NOTE — Progress Notes (Signed)
Contacted via MyChart   Good evening Martha Oconnor, your Covid testing returned negative.:)

## 2021-08-09 DIAGNOSIS — M5442 Lumbago with sciatica, left side: Secondary | ICD-10-CM | POA: Diagnosis not present

## 2021-08-09 DIAGNOSIS — M5416 Radiculopathy, lumbar region: Secondary | ICD-10-CM | POA: Diagnosis not present

## 2021-08-09 DIAGNOSIS — M5136 Other intervertebral disc degeneration, lumbar region: Secondary | ICD-10-CM | POA: Diagnosis not present

## 2021-08-09 DIAGNOSIS — M461 Sacroiliitis, not elsewhere classified: Secondary | ICD-10-CM | POA: Diagnosis not present

## 2021-08-16 ENCOUNTER — Ambulatory Visit (INDEPENDENT_AMBULATORY_CARE_PROVIDER_SITE_OTHER): Payer: BC Managed Care – PPO | Admitting: Dermatology

## 2021-08-16 ENCOUNTER — Other Ambulatory Visit: Payer: Self-pay

## 2021-08-16 DIAGNOSIS — L720 Epidermal cyst: Secondary | ICD-10-CM | POA: Diagnosis not present

## 2021-08-16 DIAGNOSIS — L732 Hidradenitis suppurativa: Secondary | ICD-10-CM | POA: Diagnosis not present

## 2021-08-16 MED ORDER — DOXYCYCLINE MONOHYDRATE 100 MG PO CAPS: 100.0000 mg | ORAL_CAPSULE | Freq: Two times a day (BID) | ORAL | 0 refills | Status: DC

## 2021-08-16 NOTE — Progress Notes (Signed)
° °  New Patient Visit  Subjective  Martha Oconnor is a 30 y.o. female who presents for the following: New Patient (Initial Visit) (Patient reports a cyst at the left inguinal area. Patient was prescribed doxycycline took for about 3 days and went away. Patient reports a boil under right arm. ).  The following portions of the chart were reviewed this encounter and updated as appropriate:   Tobacco   Allergies   Meds   Problems   Med Hx   Surg Hx   Fam Hx      Review of Systems:  No other skin or systemic complaints except as noted in HPI or Assessment and Plan.  Objective  Well appearing patient in no apparent distress; mood and affect are within normal limits.  A focused examination was performed including groin and axilla. Relevant physical exam findings are noted in the Assessment and Plan.  Left Inguinal Area, Right Axilla 1 cm cystic papule at right axilla and left buttock   Assessment & Plan  Epidermoid cyst of skin (2) Right Axilla; Left Inguinal Area  Cyst with symptoms and/or recent change.  Discussed surgical excision to remove, including resulting scar and possible recurrence.  Patient will schedule for surgery. Pre-op information given.   doxycycline (MONODOX) 100 MG capsule - Left Inguinal Area, Right Axilla Take 1 capsule (100 mg total) by mouth 2 (two) times daily. Take when cyst flared . Take with food.  Hidradenitis suppurativa Right Axilla  Hidradenitis Suppurativa is a chronic; persistent; non-curable, but treatable condition due to abnormal inflamed sweat glands in the body folds (axilla, inframammary, groin, medial thighs), causing recurrent painful draining cysts and scarring. It can be associated with severe scarring acne and cysts; also abscesses and scarring of scalp. The goal is control and prevention of flares, as it is not curable. Scars are permanent and can be thickened. Treatment may include daily use of topical medication and oral antibiotics.   Oral isotretinoin may also be helpful.  For more severe cases, Humira (a biologic injection) may be prescribed to decrease the inflammatory process and prevent flares.  When indicated, inflamed cysts may also be treated surgically.  Hand out for Humira given today   Return for schedule for surgery at right axilla , schedule surgery for left buttock, .  I, Asher Muir, CMA, am acting as scribe for Armida Sans, MD. Documentation: I have reviewed the above documentation for accuracy and completeness, and I agree with the above.  Armida Sans, MD

## 2021-08-16 NOTE — Patient Instructions (Addendum)
Doxycycline should be taken with food to prevent nausea. Do not lay down for 30 minutes after taking. Be cautious with sun exposure and use good sun protection while on this medication. Pregnant women should not take this medication.         Pre-Operative Instructions  You are scheduled for a surgical procedure at [facility]. We recommend you read the following instructions. If you have any questions or concerns, please call the office at [phone].  Shower and wash the entire body with soap and water the day of your surgery paying special attention to cleansing at and around the planned surgery site.  Avoid aspirin or aspirin containing products at least fourteen (14) days prior to your surgical procedure and for at least one week (7 Days) after your surgical procedure. If you take aspirin on a regular basis for heart disease or history of stroke or for any other reason, we may recommend you continue taking aspirin but please notify us if you take this on a regular basis. Aspirin can cause more bleeding to occur during surgery as well as prolonged bleeding and bruising after surgery.   Avoid other nonsteroidal pain medications at least one week prior to surgery and at least one week prior to your surgery. These include medications such as Ibuprofen (Motrin, Advil and Nuprin), Naprosyn, Voltaren, Relafen, etc. If medications are used for therapeutic reasons, please inform us as they can cause increased bleeding or prolonged bleeding during and bruising after surgical procedures.   Please advise [place] if you are taking any "blood thinner" medications such as Coumadin or Dipyridamole or Plavix or similar medications. These cause increased bleeding and prolonged bleeding during procedures and bruising after surgical procedures. We may have to consider discontinuing these medications briefly prior to and shortly after your surgery if safe to do so.   Please inform us of all medications you  are currently taking. All medications that are taken regularly should be taken the day of surgery as you always do. Nevertheless, we need to be informed of what medications you are taking prior to surgery to know whether they will affect the procedure or cause any complications.   Please inform us of any medication allergies. Also inform us of whether you have allergies to Latex or rubber products or whether you have had any adverse reaction to Lidocaine or Epinephrine.  Please inform us of any prosthetic or artificial body parts such as artificial heart valve, joint replacements, etc., or similar condition that might require preoperative antibiotics.   We recommend avoidance of alcohol at least two weeks prior to surgery and continued avoidance for at least two weeks after surgery.   We recommend discontinuation of tobacco smoking at least two weeks prior to surgery and continued abstinence for at least two weeks after surgery.  Do not plan strenuous exercise, strenuous work or strenuous lifting for approximately four weeks after your surgery.   We request if you are unable to make your scheduled surgical appointment, please call us at least a week in advance or as soon as you are aware of a problem so that we can cancel or reschedule the appointment.   You MAY TAKE TYLENOL (acetaminophen) for pain as it is not a blood thinner.   PLEASE PLAN TO BE IN TOWN FOR TWO WEEKS FOLLOWING SURGERY, THIS IS IMPORTANT SO YOU CAN BE CHECKED FOR DRESSING CHANGES, SUTURE REMOVAL AND TO MONITOR FOR POSSIBLE COMPLICATIONS.        If You Need  Anything After Your Visit  If you have any questions or concerns for your doctor, please call our main line at [phone] and press option 4 to reach your doctor's medical assistant. If no one answers, please leave a voicemail as directed and we will return your call as soon as possible. Messages left after 4 pm will be answered the following business day.   You may  also send [place] a message via MyChart. We typically respond to MyChart messages within 1-2 business days.  For prescription refills, please ask your pharmacy to contact our office. Our fax number is [phone].  If you have an urgent issue when the clinic is closed that cannot wait until the next business day, you can page your doctor at the number below.    Please note that while we do our best to be available for urgent issues outside of office hours, we are not available 24/7.   If you have an urgent issue and are unable to reach [place], you may choose to seek medical care at your doctor's office, retail clinic, urgent care center, or emergency room.  If you have a medical emergency, please immediately call 911 or go to the emergency department.  Pager Numbers  - Dr. Gwen Pounds: [phone]  - Dr. Neale Burly: [phone]  - Dr. Roseanne Reno: [phone]  In the event of inclement weather, please call our main line at [phone] for an update on the status of any delays or closures.  Dermatology Medication Tips: Please keep the boxes that topical medications come in in order to help keep track of the instructions about where and how to use these. Pharmacies typically print the medication instructions only on the boxes and not directly on the medication tubes.   If your medication is too expensive, please contact our office at [phone] option 4 or send [place] a message through MyChart.   We are unable to tell what your co-pay for medications will be in advance as this is different depending on your insurance coverage. However, we may be able to find a substitute medication at lower cost or fill out paperwork to get insurance to cover a needed medication.   If a prior authorization is required to get your medication covered by your insurance company, please allow [place] 1-2 business days to complete this process.  Drug prices often vary depending on where the prescription is filled and some pharmacies  may offer cheaper prices.  The website www.goodrx.com contains coupons for medications through different pharmacies. The prices here do not account for what the cost may be with help from insurance (it may be cheaper with your insurance), but the website can give you the price if you did not use any insurance.  - You can print the associated coupon and take it with your prescription to the pharmacy.  - You may also stop by our office during regular business hours and pick up a GoodRx coupon card.  - If you need your prescription sent electronically to a different pharmacy, notify our office through [facility] or by phone at [phone] option 4.     Si Usted Necesita Algo Despus de Su Visita  Tambin puede enviarnos un mensaje a travs de Clinical cytogeneticist. Por lo general respondemos a los mensajes de MyChart en el transcurso de 1 a 2 das hbiles.  Para renovar recetas, por favor pida a su farmacia que se ponga en contacto con nuestra oficina. Annie Sable de fax es [place] [phone].  Si tiene un asunto  urgente cuando la clnica est cerrada y que no puede esperar hasta el siguiente da hbil, puede llamar/localizar a su doctor(a) al nmero que aparece a continuacin.   Por favor, tenga en cuenta que aunque hacemos todo lo posible para estar disponibles para asuntos urgentes fuera del horario de [place], no estamos disponibles las 24 horas del da, los 7 [address]  [address] de la [place].   Si tiene un problema urgente y no puede comunicarse con nosotros, puede optar por buscar atencin mdica  en el consultorio de su doctor(a), en una clnica privada, en un centro de atencin urgente o en una sala de emergencias.  Si tiene Engineer, drilling, por favor llame inmediatamente al 911 o vaya a la sala de emergencias.  Nmeros de bper  - Dr. Gwen Pounds: [phone]  - Dra. Moye: [phone]  - Dra. Roseanne Reno: [phone]  En caso de inclemencias del [place], por favor llame a Lacy Duverney principal  al [phone] para una actualizacin sobre el [place] de cualquier retraso o cierre.  Consejos para la medicacin en dermatologa: Por favor, guarde las cajas en las que vienen los medicamentos de uso tpico para ayudarle a seguir las instrucciones sobre dnde y cmo usarlos. Las farmacias generalmente imprimen las instrucciones del medicamento slo en las cajas y no directamente en los tubos del [place].   Si su medicamento es muy caro, por favor, pngase en contacto con Rolm Gala llamando al [phone] y presione la opcin 4 o envenos un mensaje a travs de Clinical cytogeneticist.   No podemos decirle cul ser su copago por los medicamentos por adelantado ya que esto es diferente dependiendo de la cobertura de su seguro. Sin embargo, es posible que podamos encontrar un medicamento sustituto a Audiological scientist un formulario para que el seguro cubra el medicamento que se considera necesario.   Si se requiere una autorizacin previa para que su compaa de seguros [place] su medicamento, por favor permtanos de 1 a 2 das hbiles para completar [address]  Los precios de los medicamentos varan con frecuencia dependiendo del Environmental consultant de dnde se surte la receta y alguna farmacias pueden ofrecer precios ms baratos.  El sitio web www.goodrx.com tiene cupones para medicamentos de Health and safety inspector. Los precios aqu no tienen en cuenta lo que podra costar con la ayuda del seguro (puede ser ms barato con su seguro), pero el sitio web puede darle el precio si no utiliz Tourist information centre manager.  - Puede imprimir el cupn correspondiente y llevarlo con su receta a la farmacia.  - Tambin puede pasar por nuestra oficina durante el horario de atencin regular y Education officer, museum una tarjeta de cupones de GoodRx.  - Si necesita que su receta se enve electrnicamente a una farmacia diferente, informe a nuestra oficina a travs de MyChart de [place] o por telfono llamando al [phone] y presione la opcin 4.

## 2021-08-19 ENCOUNTER — Encounter: Payer: Self-pay | Admitting: Dermatology

## 2021-08-25 DIAGNOSIS — F319 Bipolar disorder, unspecified: Secondary | ICD-10-CM | POA: Diagnosis not present

## 2021-08-25 DIAGNOSIS — F4312 Post-traumatic stress disorder, chronic: Secondary | ICD-10-CM | POA: Diagnosis not present

## 2021-08-25 DIAGNOSIS — F411 Generalized anxiety disorder: Secondary | ICD-10-CM | POA: Diagnosis not present

## 2021-08-25 DIAGNOSIS — F902 Attention-deficit hyperactivity disorder, combined type: Secondary | ICD-10-CM | POA: Diagnosis not present

## 2021-09-10 DIAGNOSIS — M549 Dorsalgia, unspecified: Secondary | ICD-10-CM | POA: Diagnosis not present

## 2021-09-10 DIAGNOSIS — M79605 Pain in left leg: Secondary | ICD-10-CM | POA: Diagnosis not present

## 2021-09-15 DIAGNOSIS — M2569 Stiffness of other specified joint, not elsewhere classified: Secondary | ICD-10-CM | POA: Diagnosis not present

## 2021-09-17 DIAGNOSIS — M2569 Stiffness of other specified joint, not elsewhere classified: Secondary | ICD-10-CM | POA: Diagnosis not present

## 2021-09-20 DIAGNOSIS — M2569 Stiffness of other specified joint, not elsewhere classified: Secondary | ICD-10-CM | POA: Diagnosis not present

## 2021-09-23 DIAGNOSIS — M2569 Stiffness of other specified joint, not elsewhere classified: Secondary | ICD-10-CM | POA: Diagnosis not present

## 2021-09-24 DIAGNOSIS — F902 Attention-deficit hyperactivity disorder, combined type: Secondary | ICD-10-CM | POA: Diagnosis not present

## 2021-09-24 DIAGNOSIS — F319 Bipolar disorder, unspecified: Secondary | ICD-10-CM | POA: Diagnosis not present

## 2021-09-24 DIAGNOSIS — F411 Generalized anxiety disorder: Secondary | ICD-10-CM | POA: Diagnosis not present

## 2021-09-24 DIAGNOSIS — F4312 Post-traumatic stress disorder, chronic: Secondary | ICD-10-CM | POA: Diagnosis not present

## 2021-09-27 ENCOUNTER — Ambulatory Visit (INDEPENDENT_AMBULATORY_CARE_PROVIDER_SITE_OTHER): Payer: BC Managed Care – PPO | Admitting: Obstetrics and Gynecology

## 2021-09-27 ENCOUNTER — Other Ambulatory Visit (HOSPITAL_COMMUNITY)
Admission: RE | Admit: 2021-09-27 | Discharge: 2021-09-27 | Disposition: A | Discharge status: Home / Self Care | Payer: BC Managed Care – PPO | Source: Ambulatory Visit | Attending: Obstetrics and Gynecology | Admitting: Obstetrics and Gynecology

## 2021-09-27 ENCOUNTER — Other Ambulatory Visit: Payer: Self-pay

## 2021-09-27 ENCOUNTER — Encounter: Payer: Self-pay | Admitting: Obstetrics and Gynecology

## 2021-09-27 VITALS — BP 123/81 | HR 85 | Ht 64.0 in | Wt 173.4 lb

## 2021-09-27 DIAGNOSIS — Z01419 Encounter for gynecological examination (general) (routine) without abnormal findings: Secondary | ICD-10-CM | POA: Diagnosis not present

## 2021-09-27 DIAGNOSIS — M2569 Stiffness of other specified joint, not elsewhere classified: Secondary | ICD-10-CM | POA: Diagnosis not present

## 2021-09-27 DIAGNOSIS — Z30011 Encounter for initial prescription of contraceptive pills: Secondary | ICD-10-CM | POA: Diagnosis not present

## 2021-09-27 DIAGNOSIS — N946 Dysmenorrhea, unspecified: Secondary | ICD-10-CM | POA: Diagnosis not present

## 2021-09-27 MED ORDER — LEVONORGEST-ETH ESTRAD 91-DAY 0.15-0.03 &0.01 MG PO TABS: 1.0000 | ORAL_TABLET | Freq: Every day | ORAL | 1 refills | Status: DC

## 2021-09-27 NOTE — Progress Notes (Signed)
Patient presents for new annual physical. Patient states her last pap smear was in 2012. Patient reports painful periods and would like to discuss to birth control options to ease the pain.
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
## 2021-09-29 DIAGNOSIS — M2569 Stiffness of other specified joint, not elsewhere classified: Secondary | ICD-10-CM | POA: Diagnosis not present

## 2021-09-30 LAB — CYTOLOGY - PAP
Comment: NEGATIVE
Diagnosis: NEGATIVE
High risk HPV: NEGATIVE

## 2021-10-06 DIAGNOSIS — M2569 Stiffness of other specified joint, not elsewhere classified: Secondary | ICD-10-CM | POA: Diagnosis not present

## 2021-10-08 DIAGNOSIS — M2569 Stiffness of other specified joint, not elsewhere classified: Secondary | ICD-10-CM | POA: Diagnosis not present

## 2021-10-08 DIAGNOSIS — M549 Dorsalgia, unspecified: Secondary | ICD-10-CM | POA: Diagnosis not present

## 2021-10-11 DIAGNOSIS — M2569 Stiffness of other specified joint, not elsewhere classified: Secondary | ICD-10-CM | POA: Diagnosis not present

## 2021-10-12 ENCOUNTER — Encounter: Payer: BC Managed Care – PPO | Admitting: Dermatology

## 2021-10-13 DIAGNOSIS — M2569 Stiffness of other specified joint, not elsewhere classified: Secondary | ICD-10-CM | POA: Diagnosis not present

## 2021-10-18 DIAGNOSIS — M5136 Other intervertebral disc degeneration, lumbar region: Secondary | ICD-10-CM | POA: Diagnosis not present

## 2021-10-18 DIAGNOSIS — M5126 Other intervertebral disc displacement, lumbar region: Secondary | ICD-10-CM | POA: Diagnosis not present

## 2021-10-18 DIAGNOSIS — M5416 Radiculopathy, lumbar region: Secondary | ICD-10-CM | POA: Diagnosis not present

## 2021-10-18 DIAGNOSIS — M5442 Lumbago with sciatica, left side: Secondary | ICD-10-CM | POA: Diagnosis not present

## 2021-10-19 ENCOUNTER — Encounter: Payer: BC Managed Care – PPO | Admitting: Dermatology

## 2021-10-20 DIAGNOSIS — M5416 Radiculopathy, lumbar region: Secondary | ICD-10-CM | POA: Diagnosis not present

## 2021-10-20 DIAGNOSIS — M5126 Other intervertebral disc displacement, lumbar region: Secondary | ICD-10-CM | POA: Diagnosis not present

## 2021-11-02 ENCOUNTER — Other Ambulatory Visit: Payer: Self-pay | Admitting: Neurosurgery

## 2021-11-02 DIAGNOSIS — Z01818 Encounter for other preprocedural examination: Secondary | ICD-10-CM

## 2021-11-02 DIAGNOSIS — M5416 Radiculopathy, lumbar region: Secondary | ICD-10-CM | POA: Diagnosis not present

## 2021-11-10 ENCOUNTER — Inpatient Hospital Stay: Admission: RE | Admit: 2021-11-10 | Payer: BC Managed Care – PPO | Source: Ambulatory Visit

## 2021-11-12 ENCOUNTER — Other Ambulatory Visit: Payer: Self-pay

## 2021-11-12 ENCOUNTER — Encounter
Admission: RE | Admit: 2021-11-12 | Discharge: 2021-11-12 | Disposition: A | Discharge status: Home / Self Care | Payer: BC Managed Care – PPO | Source: Ambulatory Visit | Attending: Neurosurgery | Admitting: Neurosurgery

## 2021-11-12 DIAGNOSIS — Z87891 Personal history of nicotine dependence: Secondary | ICD-10-CM | POA: Diagnosis not present

## 2021-11-12 DIAGNOSIS — Z01812 Encounter for preprocedural laboratory examination: Secondary | ICD-10-CM | POA: Insufficient documentation

## 2021-11-12 DIAGNOSIS — M5116 Intervertebral disc disorders with radiculopathy, lumbar region: Secondary | ICD-10-CM | POA: Diagnosis not present

## 2021-11-12 HISTORY — DX: Cardiac murmur, unspecified: R01.1

## 2021-11-12 HISTORY — DX: Headache, unspecified: R51.9

## 2021-11-12 LAB — BASIC METABOLIC PANEL
Anion gap: 9 (ref 5–15)
BUN: 14 mg/dL (ref 6–20)
CO2: 27 mmol/L (ref 22–32)
Calcium: 9.5 mg/dL (ref 8.9–10.3)
Chloride: 104 mmol/L (ref 98–111)
Creatinine, Ser: 0.75 mg/dL (ref 0.44–1.00)
GFR, Estimated: >60 mL/min (ref >60)
Glucose, Bld: 76 mg/dL (ref 70–99)
Potassium: 4.2 mmol/L (ref 3.5–5.1)
Sodium: 140 mmol/L (ref 135–145)

## 2021-11-12 LAB — URINALYSIS, ROUTINE W REFLEX MICROSCOPIC
Bilirubin Urine: NEGATIVE
Glucose, UA: NEGATIVE mg/dL
Hgb urine dipstick: NEGATIVE
Ketones, ur: NEGATIVE mg/dL
Leukocytes,Ua: NEGATIVE
Nitrite: NEGATIVE
Protein, ur: NEGATIVE mg/dL
Specific Gravity, Urine: 1.018 (ref 1.005–1.030)
pH: 7 (ref 5.0–8.0)

## 2021-11-12 LAB — TYPE AND SCREEN
ABO/RH(D): O POS
Antibody Screen: NEGATIVE

## 2021-11-12 LAB — CBC
HCT: 41.4 % (ref 36.0–46.0)
Hemoglobin: 13.2 g/dL (ref 12.0–15.0)
MCH: 29.8 pg (ref 26.0–34.0)
MCHC: 31.9 g/dL (ref 30.0–36.0)
MCV: 93.5 fL (ref 80.0–100.0)
Platelets: 369 10*3/uL (ref 150–400)
RBC: 4.43 MIL/uL (ref 3.87–5.11)
RDW: 13.4 % (ref 11.5–15.5)
WBC: 7.9 10*3/uL (ref 4.0–10.5)
nRBC: 0 % (ref 0.0–0.2)

## 2021-11-12 LAB — PROTIME-INR
INR: 1 (ref 0.8–1.2)
Prothrombin Time: 13.3 seconds (ref 11.4–15.2)

## 2021-11-12 LAB — SURGICAL PCR SCREEN
MRSA, PCR: NEGATIVE
Staphylococcus aureus: NEGATIVE

## 2021-11-12 LAB — APTT: aPTT: 30 seconds (ref 24–36)

## 2021-11-12 NOTE — Patient Instructions (Addendum)
Your procedure is scheduled on: Monday 11/15/21 ?Report to the Registration Desk on the 1st floor of the [facility]. ?To find out your arrival time, please call [phone] between 1PM - 3PM on: Friday 11/12/21 ? ?REMEMBER: ?Instructions that are not followed completely may result in serious medical risk, up to and including death; or upon the discretion of your surgeon and anesthesiologist your surgery may need to be rescheduled. ? ?Do not eat food after midnight the night before surgery.  ?No gum chewing, lozengers or hard candies. ? ?You may however, drink CLEAR liquids up to 2 hours before you are scheduled to arrive for your surgery. Do not drink anything within 2 hours of your scheduled arrival time. ? ?Clear liquids include: ?- water  ?- apple juice without pulp ?- gatorade (not RED colors) ?- black coffee or tea (Do NOT add milk or creamers to the coffee or tea) ?Do NOT drink anything that is not on this list. ? ?TAKE THESE MEDICATIONS THE MORNING OF SURGERY WITH A SIP OF WATER: ?gabapentin (NEURONTIN) 300 MG capsule ?sertraline (ZOLOFT) 50 MG tablet ?VYVANSE 40 MG capsule ? ?One week prior to surgery: ?Stop Anti-inflammatories (NSAIDS) such as Advil, Aleve, Ibuprofen, Motrin, Naproxen, Naprosyn and Aspirin based products such as Excedrin, Goodys Powder, BC Powder. ? ?Stop ANY OVER THE COUNTER supplements until after surgery. ? ?You may however, continue to take Tylenol if needed for pain up until the day of surgery. ? ?No Alcohol for 24 hours before or after surgery. ? ?No Smoking including e-cigarettes for 24 hours prior to surgery.  ?No chewable tobacco products for at least 6 hours prior to surgery.  ?No nicotine patches on the day of surgery. ? ?Do not use any "recreational" drugs for at least a week prior to your surgery.  ?Please be advised that the combination of cocaine and anesthesia may have negative outcomes, up to and including death. ?If you test positive for cocaine, your surgery will be  cancelled. ? ?On the morning of surgery brush your teeth with toothpaste and water, you may rinse your mouth with mouthwash if you wish. ?Do not swallow any toothpaste or mouthwash. ? ?Use CHG Soap as directed on instruction sheet. ? ?Do not wear jewelry, make-up, hairpins, clips or nail polish. ? ?Do not wear lotions, powders, or perfumes.  ? ?Do not shave body from the neck down 48 hours prior to surgery just in case you cut yourself which could leave a site for infection.  ?Also, freshly shaved skin may become irritated if using the CHG soap. ? ?Do not bring valuables to the hospital. [facility] is not responsible for any missing/lost belongings or valuables.  ? ?Notify your doctor if there is any change in your medical condition (cold, fever, infection). ? ?Wear comfortable clothing (specific to your surgery type) to the hospital. ? ?If you are being discharged the day of surgery, you will not be allowed to drive home. ?You will need a responsible adult (18 years or older) to drive you home and stay with you that night.  ? ?If you are taking public transportation, you will need to have a responsible adult (18 years or older) with you. ?Please confirm with your physician that it is acceptable to use public transportation.  ? ?Please call the Pre-admissions Testing Dept. at [phone] if you have any questions about these instructions. ? ?Surgery Visitation Policy: ? ?Patients undergoing a surgery or procedure may have one family member or support person with them  as long as that person is not COVID-19 positive or experiencing its symptoms.  ?That person may remain in the waiting area during the procedure and may rotate out with other people. ? ?Inpatient Visitation:   ? ?Visiting hours are 7 a.m. to 8 p.m. ?Up to two visitors ages 16+ are allowed at one time in a patient room. The visitors may rotate out with other people during the day. Visitors must check out when they leave, or other visitors will not be  allowed. One designated support person may remain overnight. ?The visitor must pass COVID-19 screenings, use hand sanitizer when entering and exiting the patient?s room and wear a mask at all times, including in the patient?s room. ?Patients must also wear a mask when staff or their visitor are in the room. ?Masking is required regardless of vaccination status.  ?

## 2021-11-14 MED ORDER — CEFAZOLIN SODIUM-DEXTROSE 2-4 GM/100ML-% IV SOLN
2.0000 g | INTRAVENOUS | Status: AC
  Administered 2021-11-15: 2 g via INTRAVENOUS

## 2021-11-14 MED ORDER — CHLORHEXIDINE GLUCONATE 0.12 % MT SOLN: 15.0000 mL | Freq: Once | OROMUCOSAL | Status: AC

## 2021-11-14 MED ORDER — LACTATED RINGERS IV SOLN: INTRAVENOUS | Status: DC

## 2021-11-14 MED ORDER — FAMOTIDINE 20 MG PO TABS
20.0000 mg | ORAL_TABLET | Freq: Once | ORAL | Status: AC
Start: 2021-11-14 — End: 2021-11-15

## 2021-11-14 MED ORDER — ORAL CARE MOUTH RINSE: 15.0000 mL | Freq: Once | OROMUCOSAL | Status: AC

## 2021-11-14 NOTE — Progress Notes (Signed)
Pharmacy Antibiotic Note ? ?Martha Oconnor is a 31 y.o. female admitted on (Not on file) with surgical prophylaxis.  Pharmacy has been consulted for Cefazolin dosing.  Pt has hx of allergy/intolerance to cefaclor with unknown rxn as young child.   MD would like to try test dose of cefazolin.  ? ?Plan: ?TBW = 78.8 kg  ? ?Cefazolin 2 gm IV X 1 60 min pre-op ordered for 3/20 @ 0500.  ? ?  ? ?No data recorded. ? ?Recent Labs  ?Lab 11/12/21 ?1501  ?WBC 7.9  ?CREATININE 0.75  ?  ?Estimated Creatinine Clearance: 102.5 mL/min (by C-G formula based on SCr of 0.75 mg/dL).   ? ?Allergies  ?Allergen Reactions  ? Ceclor [Cefaclor] Other (See Comments)  ?  Unknown (rxn as young child)  ? ? ?Antimicrobials this admission: ?  >>  ?  >>  ? ?Dose adjustments this admission: ? ? ?Microbiology results: ? BCx:  ? UCx:   ? Sputum:   ? MRSA PCR:  ? ?Thank you for allowing pharmacy to be a part of this patient?s care. ? ?Malacai Grantz D ?11/14/2021 11:15 PM ? ?

## 2021-11-15 ENCOUNTER — Encounter
Admission: RE | Disposition: A | Discharge status: Home / Self Care | Payer: Self-pay | Source: Ambulatory Visit | Attending: Neurosurgery

## 2021-11-15 ENCOUNTER — Encounter: Payer: Self-pay | Admitting: Neurosurgery

## 2021-11-15 ENCOUNTER — Ambulatory Visit
Admission: RE | Admit: 2021-11-15 | Discharge: 2021-11-15 | Disposition: A | Discharge status: Home / Self Care | Payer: BC Managed Care – PPO | Source: Ambulatory Visit | Attending: Neurosurgery | Admitting: Neurosurgery

## 2021-11-15 ENCOUNTER — Ambulatory Visit: Payer: BC Managed Care – PPO | Admitting: Urgent Care

## 2021-11-15 ENCOUNTER — Ambulatory Visit: Payer: BC Managed Care – PPO | Admitting: Certified Registered"

## 2021-11-15 ENCOUNTER — Other Ambulatory Visit: Payer: Self-pay

## 2021-11-15 ENCOUNTER — Ambulatory Visit: Payer: BC Managed Care – PPO

## 2021-11-15 DIAGNOSIS — Z4889 Encounter for other specified surgical aftercare: Secondary | ICD-10-CM | POA: Diagnosis not present

## 2021-11-15 DIAGNOSIS — M5116 Intervertebral disc disorders with radiculopathy, lumbar region: Secondary | ICD-10-CM | POA: Insufficient documentation

## 2021-11-15 DIAGNOSIS — Z87891 Personal history of nicotine dependence: Secondary | ICD-10-CM | POA: Diagnosis not present

## 2021-11-15 DIAGNOSIS — M5416 Radiculopathy, lumbar region: Secondary | ICD-10-CM | POA: Diagnosis not present

## 2021-11-15 HISTORY — PX: LUMBAR LAMINECTOMY/DECOMPRESSION MICRODISCECTOMY: SHX5026

## 2021-11-15 LAB — POCT PREGNANCY, URINE: Preg Test, Ur: NEGATIVE

## 2021-11-15 LAB — ABO/RH: ABO/RH(D): O POS

## 2021-11-15 SURGERY — LUMBAR LAMINECTOMY/DECOMPRESSION MICRODISCECTOMY 1 LEVEL
Anesthesia: General | Site: Back | Laterality: Left

## 2021-11-15 MED ORDER — GABAPENTIN 600 MG PO TABS
300.0000 mg | ORAL_TABLET | Freq: Once | ORAL | Status: DC
  Filled 2021-11-15: qty 0.5

## 2021-11-15 MED ORDER — EPHEDRINE 5 MG/ML INJ
INTRAVENOUS | Status: AC
  Filled 2021-11-15: qty 5

## 2021-11-15 MED ORDER — BUPIVACAINE LIPOSOME 1.3 % IJ SUSP
INTRAMUSCULAR | Status: AC
  Filled 2021-11-15: qty 20

## 2021-11-15 MED ORDER — DEXAMETHASONE SODIUM PHOSPHATE 10 MG/ML IJ SOLN
INTRAMUSCULAR | Status: AC
  Filled 2021-11-15: qty 1

## 2021-11-15 MED ORDER — FENTANYL CITRATE (PF) 100 MCG/2ML IJ SOLN
INTRAMUSCULAR | Status: AC
  Filled 2021-11-15: qty 2

## 2021-11-15 MED ORDER — ACETAMINOPHEN 500 MG PO TABS
ORAL_TABLET | ORAL | Status: AC
  Administered 2021-11-15: 1000 mg via ORAL
  Filled 2021-11-15: qty 2

## 2021-11-15 MED ORDER — CEFAZOLIN SODIUM-DEXTROSE 2-4 GM/100ML-% IV SOLN
INTRAVENOUS | Status: AC
  Filled 2021-11-15: qty 100

## 2021-11-15 MED ORDER — GABAPENTIN 300 MG PO CAPS
ORAL_CAPSULE | ORAL | Status: AC
  Filled 2021-11-15: qty 1

## 2021-11-15 MED ORDER — OXYCODONE HCL 5 MG/5ML PO SOLN: 5.0000 mg | Freq: Once | ORAL | Status: DC | PRN

## 2021-11-15 MED ORDER — OXYCODONE HCL 5 MG PO TABS: 5.0000 mg | ORAL_TABLET | Freq: Once | ORAL | Status: DC | PRN

## 2021-11-15 MED ORDER — GABAPENTIN 300 MG PO CAPS: 300.0000 mg | ORAL_CAPSULE | Freq: Once | ORAL | Status: DC

## 2021-11-15 MED ORDER — FAMOTIDINE 20 MG PO TABS
ORAL_TABLET | ORAL | Status: AC
  Administered 2021-11-15: 20 mg via ORAL
  Filled 2021-11-15: qty 1

## 2021-11-15 MED ORDER — REMIFENTANIL HCL 1 MG IV SOLR
INTRAVENOUS | Status: DC | PRN
  Administered 2021-11-15: .1 ug/kg/min via INTRAVENOUS

## 2021-11-15 MED ORDER — ACETAMINOPHEN 500 MG PO TABS: 1000.0000 mg | ORAL_TABLET | Freq: Once | ORAL | Status: AC

## 2021-11-15 MED ORDER — KETOROLAC TROMETHAMINE 30 MG/ML IJ SOLN
INTRAMUSCULAR | Status: DC | PRN
  Administered 2021-11-15: 15 mg via INTRAVENOUS

## 2021-11-15 MED ORDER — BUPIVACAINE-EPINEPHRINE (PF) 0.5% -1:200000 IJ SOLN
INTRAMUSCULAR | Status: DC | PRN
  Administered 2021-11-15: 3 mL

## 2021-11-15 MED ORDER — CHLORHEXIDINE GLUCONATE 0.12 % MT SOLN
OROMUCOSAL | Status: AC
  Administered 2021-11-15: 15 mL via OROMUCOSAL
  Filled 2021-11-15: qty 15

## 2021-11-15 MED ORDER — SODIUM CHLORIDE (PF) 0.9 % IJ SOLN
INTRAMUSCULAR | Status: DC | PRN
  Administered 2021-11-15: 60 mL

## 2021-11-15 MED ORDER — OXYCODONE-ACETAMINOPHEN 5-325 MG PO TABS
1.0000 | ORAL_TABLET | ORAL | 0 refills | Status: AC | PRN
Start: 2021-11-15 — End: 2021-11-20

## 2021-11-15 MED ORDER — SURGIFLO WITH THROMBIN (HEMOSTATIC MATRIX KIT) OPTIME
TOPICAL | Status: DC | PRN
  Administered 2021-11-15: 1 via TOPICAL

## 2021-11-15 MED ORDER — SODIUM CHLORIDE (PF) 0.9 % IJ SOLN
INTRAMUSCULAR | Status: AC
  Filled 2021-11-15: qty 20

## 2021-11-15 MED ORDER — SENNA 8.6 MG PO TABS: 1.0000 | ORAL_TABLET | Freq: Every day | ORAL | 0 refills | Status: DC | PRN

## 2021-11-15 MED ORDER — LIDOCAINE HCL (CARDIAC) PF 100 MG/5ML IV SOSY
PREFILLED_SYRINGE | INTRAVENOUS | Status: DC | PRN
  Administered 2021-11-15: 80 mg via INTRAVENOUS

## 2021-11-15 MED ORDER — 0.9 % SODIUM CHLORIDE (POUR BTL) OPTIME
TOPICAL | Status: DC | PRN
  Administered 2021-11-15: 500 mL

## 2021-11-15 MED ORDER — CELECOXIB 100 MG PO CAPS: 100.0000 mg | ORAL_CAPSULE | Freq: Two times a day (BID) | ORAL | 0 refills | Status: DC

## 2021-11-15 MED ORDER — BUPIVACAINE HCL (PF) 0.5 % IJ SOLN
INTRAMUSCULAR | Status: AC
  Filled 2021-11-15: qty 30

## 2021-11-15 MED ORDER — FENTANYL CITRATE (PF) 100 MCG/2ML IJ SOLN
25.0000 ug | INTRAMUSCULAR | Status: DC | PRN
  Administered 2021-11-15 (×3): 25 ug via INTRAVENOUS

## 2021-11-15 MED ORDER — MIDAZOLAM HCL 2 MG/2ML IJ SOLN
INTRAMUSCULAR | Status: DC | PRN
  Administered 2021-11-15: 2 mg via INTRAVENOUS

## 2021-11-15 MED ORDER — SODIUM CHLORIDE FLUSH 0.9 % IV SOLN
INTRAVENOUS | Status: AC
  Filled 2021-11-15: qty 20

## 2021-11-15 MED ORDER — DROPERIDOL 2.5 MG/ML IJ SOLN
0.6250 mg | Freq: Once | INTRAMUSCULAR | Status: DC | PRN
  Filled 2021-11-15: qty 2

## 2021-11-15 MED ORDER — FENTANYL CITRATE (PF) 100 MCG/2ML IJ SOLN
INTRAMUSCULAR | Status: DC | PRN
  Administered 2021-11-15 (×2): 50 ug via INTRAVENOUS

## 2021-11-15 MED ORDER — SUCCINYLCHOLINE CHLORIDE 200 MG/10ML IV SOSY
PREFILLED_SYRINGE | INTRAVENOUS | Status: AC
  Filled 2021-11-15: qty 10

## 2021-11-15 MED ORDER — METHYLPREDNISOLONE ACETATE 40 MG/ML IJ SUSP
INTRAMUSCULAR | Status: AC
  Filled 2021-11-15: qty 1

## 2021-11-15 MED ORDER — METHYLPREDNISOLONE ACETATE 40 MG/ML IJ SUSP
INTRAMUSCULAR | Status: DC | PRN
  Administered 2021-11-15: 40 mg

## 2021-11-15 MED ORDER — PROPOFOL 10 MG/ML IV BOLUS
INTRAVENOUS | Status: DC | PRN
  Administered 2021-11-15: 150 mg via INTRAVENOUS

## 2021-11-15 MED ORDER — ACETAMINOPHEN 10 MG/ML IV SOLN: 1000.0000 mg | Freq: Once | INTRAVENOUS | Status: DC | PRN

## 2021-11-15 MED ORDER — EPHEDRINE SULFATE (PRESSORS) 50 MG/ML IJ SOLN
INTRAMUSCULAR | Status: DC | PRN
  Administered 2021-11-15: 10 mg via INTRAVENOUS

## 2021-11-15 MED ORDER — SUCCINYLCHOLINE CHLORIDE 200 MG/10ML IV SOSY
PREFILLED_SYRINGE | INTRAVENOUS | Status: DC | PRN
  Administered 2021-11-15: 100 mg via INTRAVENOUS

## 2021-11-15 MED ORDER — ONDANSETRON HCL 4 MG/2ML IJ SOLN
INTRAMUSCULAR | Status: DC | PRN
  Administered 2021-11-15: 4 mg via INTRAVENOUS

## 2021-11-15 MED ORDER — ONDANSETRON HCL 4 MG/2ML IJ SOLN
INTRAMUSCULAR | Status: AC
  Filled 2021-11-15: qty 2

## 2021-11-15 MED ORDER — MIDAZOLAM HCL 2 MG/2ML IJ SOLN
INTRAMUSCULAR | Status: AC
  Filled 2021-11-15: qty 2

## 2021-11-15 MED ORDER — FENTANYL CITRATE (PF) 100 MCG/2ML IJ SOLN
INTRAMUSCULAR | Status: AC
  Administered 2021-11-15: 25 ug via INTRAVENOUS
  Filled 2021-11-15: qty 2

## 2021-11-15 MED ORDER — PROPOFOL 10 MG/ML IV BOLUS
INTRAVENOUS | Status: AC
  Filled 2021-11-15: qty 20

## 2021-11-15 MED ORDER — DEXAMETHASONE SODIUM PHOSPHATE 10 MG/ML IJ SOLN
INTRAMUSCULAR | Status: DC | PRN
  Administered 2021-11-15: 5 mg via INTRAVENOUS

## 2021-11-15 MED ORDER — PHENYLEPHRINE HCL (PRESSORS) 10 MG/ML IV SOLN
INTRAVENOUS | Status: DC | PRN
Start: 2021-11-15 — End: 2021-11-15
  Administered 2021-11-15 (×2): 100 ug via INTRAVENOUS

## 2021-11-15 MED ORDER — PHENYLEPHRINE HCL-NACL 20-0.9 MG/250ML-% IV SOLN
INTRAVENOUS | Status: DC | PRN
  Administered 2021-11-15: 40 ug/min via INTRAVENOUS

## 2021-11-15 MED ORDER — BUPIVACAINE-EPINEPHRINE (PF) 0.5% -1:200000 IJ SOLN
INTRAMUSCULAR | Status: AC
  Filled 2021-11-15: qty 30

## 2021-11-15 MED ORDER — LIDOCAINE HCL (PF) 2 % IJ SOLN
INTRAMUSCULAR | Status: AC
  Filled 2021-11-15: qty 5

## 2021-11-15 MED ORDER — REMIFENTANIL HCL 1 MG IV SOLR
INTRAVENOUS | Status: AC
  Filled 2021-11-15: qty 1000

## 2021-11-15 SURGICAL SUPPLY — 58 items
ADH SKN CLS APL DERMABOND .7 (GAUZE/BANDAGES/DRESSINGS) ×1
AGENT HMST KT MTR STRL THRMB (HEMOSTASIS) ×1
APL PRP STRL LF DISP 70% ISPRP (MISCELLANEOUS) ×2
BUR NEURO DRILL SOFT 3.0X3.8M (BURR) ×2 IMPLANT
CHLORAPREP W/TINT 26 (MISCELLANEOUS) ×4 IMPLANT
CNTNR SPEC 2.5X3XGRAD LEK (MISCELLANEOUS) ×1
CONT SPEC 4OZ STER OR WHT (MISCELLANEOUS) ×1
CONT SPEC 4OZ STRL OR WHT (MISCELLANEOUS) ×1
CONTAINER SPEC 2.5X3XGRAD LEK (MISCELLANEOUS) ×1 IMPLANT
COUNTER NEEDLE 20/40 LG (NEEDLE) ×2 IMPLANT
CUP MEDICINE 2OZ PLAST GRAD ST (MISCELLANEOUS) ×4 IMPLANT
DERMABOND ADVANCED (GAUZE/BANDAGES/DRESSINGS) ×1
DERMABOND ADVANCED .7 DNX12 (GAUZE/BANDAGES/DRESSINGS) ×1 IMPLANT
DRAPE C ARM PK CFD 31 SPINE (DRAPES) ×2 IMPLANT
DRAPE LAPAROTOMY 100X77 ABD (DRAPES) ×2 IMPLANT
DRAPE MICROSCOPE SPINE 48X150 (DRAPES) ×2 IMPLANT
DRAPE SURG 17X11 SM STRL (DRAPES) ×2 IMPLANT
DRSG OPSITE POSTOP 3X4 (GAUZE/BANDAGES/DRESSINGS) ×1 IMPLANT
ELECT CAUTERY BLADE TIP 2.5 (TIP) ×2
ELECT EZSTD 165MM 6.5IN (MISCELLANEOUS)
ELECT REM PT RETURN 9FT ADLT (ELECTROSURGICAL) ×2
ELECTRODE CAUTERY BLDE TIP 2.5 (TIP) ×1 IMPLANT
ELECTRODE EZSTD 165MM 6.5IN (MISCELLANEOUS) IMPLANT
ELECTRODE REM PT RTRN 9FT ADLT (ELECTROSURGICAL) ×1 IMPLANT
GAUZE 4X4 16PLY ~~LOC~~+RFID DBL (SPONGE) ×2 IMPLANT
GLOVE SURG SYN 6.5 ES PF (GLOVE) ×4 IMPLANT
GLOVE SURG SYN 6.5 PF PI (GLOVE) ×2 IMPLANT
GLOVE SURG SYN 8.5  E (GLOVE) ×3
GLOVE SURG SYN 8.5 E (GLOVE) ×3 IMPLANT
GLOVE SURG SYN 8.5 PF PI (GLOVE) ×3 IMPLANT
GLOVE SURG UNDER POLY LF SZ6.5 (GLOVE) ×2 IMPLANT
GOWN SRG LRG LVL 4 IMPRV REINF (GOWNS) ×1 IMPLANT
GOWN SRG XL LVL 3 NONREINFORCE (GOWNS) ×1 IMPLANT
GOWN STRL NON-REIN TWL XL LVL3 (GOWNS) ×2
GOWN STRL REIN LRG LVL4 (GOWNS) ×2
GRADUATE 1200CC STRL 31836 (MISCELLANEOUS) ×2 IMPLANT
GRAFT DURAGEN MATRIX 1WX1L (Tissue) IMPLANT
KIT SPINAL PRONEVIEW (KITS) ×2 IMPLANT
MANIFOLD NEPTUNE II (INSTRUMENTS) ×2 IMPLANT
MARKER SKIN DUAL TIP RULER LAB (MISCELLANEOUS) ×4 IMPLANT
NDL SAFETY ECLIPSE 18X1.5 (NEEDLE) ×1 IMPLANT
NEEDLE HYPO 18GX1.5 SHARP (NEEDLE) ×2
NEEDLE HYPO 22GX1.5 SAFETY (NEEDLE) ×2 IMPLANT
NS IRRIG 1000ML POUR BTL (IV SOLUTION) ×2 IMPLANT
PACK LAMINECTOMY NEURO (CUSTOM PROCEDURE TRAY) ×2 IMPLANT
PAD ARMBOARD 7.5X6 YLW CONV (MISCELLANEOUS) ×2 IMPLANT
SURGIFLO W/THROMBIN 8M KIT (HEMOSTASIS) ×2 IMPLANT
SUT DVC VLOC 3-0 CL 6 P-12 (SUTURE) ×2 IMPLANT
SUT VIC AB 0 CT1 27 (SUTURE) ×2
SUT VIC AB 0 CT1 27XCR 8 STRN (SUTURE) ×1 IMPLANT
SUT VIC AB 2-0 CT1 18 (SUTURE) ×2 IMPLANT
SYR 10ML LL (SYRINGE) ×2 IMPLANT
SYR 20ML LL LF (SYRINGE) ×2 IMPLANT
SYR 30ML LL (SYRINGE) ×4 IMPLANT
SYR 3ML LL SCALE MARK (SYRINGE) ×2 IMPLANT
TOWEL OR 17X26 4PK STRL BLUE (TOWEL DISPOSABLE) ×6 IMPLANT
TUBING CONNECTING 10 (TUBING) ×2 IMPLANT
WATER STERILE IRR 500ML POUR (IV SOLUTION) ×1 IMPLANT

## 2021-11-15 NOTE — Anesthesia Procedure Notes (Signed)
Procedure Name: Intubation ?Date/Time: 11/15/2021 9:34 AM ?Performed by: Fredderick Phenix, CRNA ?Pre-anesthesia Checklist: Patient identified, Emergency Drugs available, Suction available and Patient being monitored ?Patient Re-evaluated:Patient Re-evaluated prior to induction ?Oxygen Delivery Method: Circle system utilized ?Preoxygenation: Pre-oxygenation with 100% oxygen ?Induction Type: IV induction ?Ventilation: Mask ventilation without difficulty ?Grade View: Grade I ?Tube type: Oral ?Tube size: 7.0 mm ?Number of attempts: 1 ?Airway Equipment and Method: Stylet and Oral airway ?Placement Confirmation: ETT inserted through vocal cords under direct vision, positive ETCO2 and breath sounds checked- equal and bilateral ?Secured at: 21 cm ?Tube secured with: Tape ?Dental Injury: Teeth and Oropharynx as per pre-operative assessment  ? ? ? ? ?

## 2021-11-15 NOTE — Anesthesia Preprocedure Evaluation (Addendum)
Anesthesia Evaluation  ?Patient identified by MRN, date of birth, ID band ?Patient awake ? ? ? ?Reviewed: ?Allergy & Precautions, NPO status , Patient's Chart, lab work & pertinent test results ? ?Airway ?Mallampati: II ? ?TM Distance: >3 FB ?Neck ROM: full ? ? ? Dental ? ?(+) Missing,  ?  ?Pulmonary ?neg pulmonary ROS, former smoker,  ?  ?Pulmonary exam normal ? ? ? ? ? ? ? Cardiovascular ?Exercise Tolerance: Good ?Normal cardiovascular exam+ Valvular Problems/Murmurs  ? ? ?  ?Neuro/Psych ? Headaches, PSYCHIATRIC DISORDERS PTSD/ Depression/ AnxietyLumbar radiculopathy ? Neuromuscular disease   ? GI/Hepatic ?negative GI ROS, (+)  ?  ? substance abuse ? marijuana use,   ?Endo/Other  ?negative endocrine ROS ? Renal/GU ?  ? ?  ?Musculoskeletal ? ? Abdominal ?Normal abdominal exam  (+)   ?Peds ? Hematology ?negative hematology ROS ?(+)   ?Anesthesia Other Findings ?Past Medical History: ?No date: ADHD ?No date: Anxiety ?No date: Clotting disorder (HCC) ?No date: Depression ?No date: Headache ?    Comment:  migraine ?No date: Heart murmur ?No date: PTSD (post-traumatic stress disorder) ? ?Past Surgical History: ?No date: TUBAL LIGATION ? ? ? ? Reproductive/Obstetrics ?negative OB ROS ? ?  ? ? ? ? ? ? ? ? ? ? ? ? ? ?  ?  ? ? ? ? ? ? ? ?Anesthesia Physical ?Anesthesia Plan ? ?ASA: 2 ? ?Anesthesia Plan: General ETT  ? ?Post-op Pain Management: Regional block* and Tylenol PO (pre-op)*  ? ?Induction: Intravenous ? ?PONV Risk Score and Plan: Ondansetron, Dexamethasone, Midazolam and Treatment may vary due to age or medical condition ? ?Airway Management Planned: Oral ETT ? ?Additional Equipment:  ? ?Intra-op Plan:  ? ?Post-operative Plan: Extubation in OR ? ?Informed Consent: I have reviewed the patients History and Physical, chart, labs and discussed the procedure including the risks, benefits and alternatives for the proposed anesthesia with the patient or authorized representative who has  indicated his/her understanding and acceptance.  ? ? ? ?Dental advisory given ? ?Plan Discussed with: Anesthesiologist, CRNA and Surgeon ? ?Anesthesia Plan Comments:   ? ? ? ? ? ?Anesthesia Quick Evaluation ? ?

## 2021-11-15 NOTE — Discharge Summary (Signed)
Physician Discharge Summary  ?Patient ID: ?[name] ?MRN: [id number] ?DOB/AGE: [birth year] 33 y.o. ? ?Admit date: 11/15/2021 ?Discharge date: 11/15/2021 ? ?Admission Diagnoses: lumbar radiculopathy  ? ?Discharge Diagnoses:  ?Active Problems: ?  * No active hospital problems. * ? ? ?Discharged Condition: good ? ?Hospital Course:  ?Martha Oconnor is a 31 y.o s/p left L5-S1 microdiscectomy for radiculopathy. Her interoperative course was uncomplicated. She was monitored post-op. She was discharged home after ambulating, urinating, and tolerating PO intake. She was given prescriptions Percocet, Celebrex, and Senna.  ? ?Consults: None ? ?Significant Diagnostic Studies: none  ? ?Treatments: surgery: as above. Please see separately dictated operative report for further details.  ? ?Discharge Exam: ?Blood pressure 133/81, pulse 82, temperature (!) 97.4 ?F (36.3 ?C), temperature source Temporal, resp. rate 18, SpO2 98 %. ?CN II-XII grossly intact ?5/5 throughout BLE except 4+/5 BLE PF  ?Incision c/d/i ? ?Disposition: Discharge disposition: 01-Home or Self Care ? ? ? ? ? ? ?Discharge Instructions   ? ? Remove dressing in 24 hours   Complete by: As directed ?  ? ?  ? ?Allergies as of 11/15/2021   ? ?   Reactions  ? Ceclor [cefaclor] Other (See Comments)  ? Unknown (rxn as young child)  ? ?  ? ?  ?Medication List  ?  ? ?TAKE these medications   ? ?celecoxib 100 MG capsule ?Commonly known as: CeleBREX ?Take 1 capsule (100 mg total) by mouth 2 (two) times daily. ?  ?cyclobenzaprine 10 MG tablet ?Commonly known as: FLEXERIL ?Take 10 mg by mouth 3 (three) times daily as needed for muscle spasms. ?  ?doxycycline 100 MG capsule ?Commonly known as: MONODOX ?Take 100 mg by mouth daily as needed (Skin cond.). ?  ?gabapentin 300 MG capsule ?Commonly known as: NEURONTIN ?Take 300 mg by mouth 2 (two) times daily. ?  ?Levonorgestrel-Ethinyl Estradiol 0.15-0.03 &0.01 MG tablet ?Commonly known as: AMETHIA ?Take 1 tablet by mouth at  bedtime. ?  ?oxyCODONE-acetaminophen 5-325 MG tablet ?Commonly known as: Percocet ?Take 1 tablet by mouth every 4 (four) hours as needed for up to 5 days for severe pain. ?  ?senna 8.6 MG Tabs tablet ?Commonly known as: SENOKOT ?Take 1 tablet (8.6 mg total) by mouth daily as needed for mild constipation. ?  ?sertraline 50 MG tablet ?Commonly known as: ZOLOFT ?Take 50 mg by mouth daily. ?  ?Vyvanse 40 MG capsule ?Generic drug: lisdexamfetamine ?Take 40 mg by mouth every morning. ?  ? ?  ? ? Follow-up Information   ? ? Susanne Borders, PA. Go on 12/02/2021.   ?Why: at 2:30 pm for incision and post-op check. The appointment date and time should be included in your pre-op paperwork ?Contact information: ?[address] ?[place] [address] ?[phone] ? ? ?  ?  ? ?  ?  ? ?  ? ? ?Signed: ?Susanne Borders ?11/15/2021, 1:36 PM ? ? ?

## 2021-11-15 NOTE — Op Note (Signed)
Indications: Martha Oconnor is a 31 yo female who presented with M54.16 lumbar radiculopathy. She failed conservative management prompting surgical intervention. ? ?Findings: disc herniation L5-S1 ? ?Preoperative Diagnosis: Lumbar radiculopathy ?Postoperative Diagnosis: same ? ? ?EBL: 10 ml ?IVF: 800 ml ?Drains: none ?Disposition: Extubated and Stable to PACU ?Complications: none ? ?No foley catheter was placed. ? ? ?Preoperative Note:  ? ?Risks of surgery discussed include: infection, bleeding, stroke, coma, death, paralysis, CSF leak, nerve/spinal cord injury, numbness, tingling, weakness, complex regional pain syndrome, recurrent stenosis and/or disc herniation, vascular injury, development of instability, neck/back pain, need for further surgery, persistent symptoms, development of deformity, and the risks of anesthesia. The patient understood these risks and agreed to proceed. ? ?Operative Note:  ? ?1) Left L5/S1 microdiscectomy ? ?The patient was then brought from the preoperative center with intravenous access established.  The patient underwent general anesthesia and endotracheal tube intubation, and was then rotated on the Maitland rail top where all pressure points were appropriately padded.  The skin was then thoroughly cleansed.  Perioperative antibiotic prophylaxis was administered.  Sterile prep and drapes were then applied and a timeout was then observed.  C-arm was brought into the field under sterile conditions, and the L5-S1 disc space identified and marked with an incision on the left 1cm lateral to midline. ? ?Once this was complete a 2 cm incision was opened with the use of a #10 blade knife.  The Metrx tubes were sequentially advanced under lateral fluoroscopy until a 18 x 80 mm Metrx tube was placed over the facet and lamina and secured to the bed.   ? ?The microscope was then sterilely brought into the field and muscle creep was hemostased with a bipolar and resected with a pituitary rongeur.  A  Bovie extender was then used to expose the spinous process and lamina.  Careful attention was placed to not violate the facet capsule. A 3 mm matchstick drill bit was then used to make a hemi-laminotomy trough until the ligamentum flavum was exposed.  This was extended to the base of the spinous process.  Once this was complete and the underlying ligamentum flavum was visualized this was dissected with an up angle curette and resected with a #2 and #3 mm biting Kerrison.  The laminotomy opening was also expanded in similar fashion and hemostasis was obtained with Surgifoam and a patty as well as bone wax.  The rostral aspect of the caudal level of the lamina was also resected with a #2 biting Kerrison effort to further enhance exposure.  Once the underlying dura was visualized a Penfield 4 was then used to dissect and expose the traversing nerve root.  Once this was identified a nerve root retractor suction was used to mobilize this medially.  The venous plexus was hemostased with Surgifoam and light bipolar use.  A small penfied was then used to make a small annulotomy within the disc space and disc space contents were noted to come through the annulus.   ? ?The disc herniation was identified and dissected free using a balltip probe. The pituitary rongeur was used to remove the extruded disc fragments. Once the thecal sac and nerve root were noted to be relaxed and under less tension the ball-tipped feeler was passed along the foramen distally to to ensure no residual compression was noted.   ? ?Depo-Medrol was placed along the nerve root.  The area was irrigated. The tube system was then removed under microscopic visualization and hemostasis was obtained with  a bipolar.   ? ?The fascial layer was reapproximated with the use of a 0- Vicryl suture.  Subcutaneous tissue layer was reapproximated using 2-0 Vicryl suture.  3-0 monocryl was used on the skin. The skin was then cleansed and Dermabond was used to close the  skin opening.  Patient was then rotated back to the preoperative bed awakened from anesthesia and taken to recovery all counts are correct in this case. ? ? ?I performed the entire procedure with the assistance of Manning Charity PA as an Designer, television/film set. ? ?Venetia Night MD ? ?

## 2021-11-15 NOTE — H&P (Signed)
I have reviewed and confirmed my history and physical from 11/02/2021 with no additions or changes. Plan for L L5-S1 microdiscectomy.  Risks and benefits reviewed. ? ?Heart sounds normal no MRG. Chest Clear to Auscultation Bilaterally. ? ? ?  ? ?

## 2021-11-15 NOTE — OR Nursing (Signed)
Ambulatory, tolerating po's, painfree prior to discharge to home. ?

## 2021-11-15 NOTE — OR Nursing (Signed)
During assessment in pre-op patient was asked to remove bilateral nipple piercings prior to surgery. Patient stated that she did not want to remove them for fear of them closing up. RN educated patient that trauma to the nipple, pressure ulcers, and positioning injuries which included them being ripped out even if taped and protected and that her chest would be supported right in that area providing direct pressure at the nipples because she would be on her stomach. Patient stated that she sleeps on her stomach at home and wanted to keep them in. ?

## 2021-11-15 NOTE — Transfer of Care (Signed)
Immediate Anesthesia Transfer of Care Note ? ?Patient: Martha Oconnor ? ?Procedure(s) Performed: LEFT L5-S1 MICRODISCECTOMY (Left: Back) ? ?Patient Location: PACU ? ?Anesthesia Type:General ? ?Level of Consciousness: drowsy ? ?Airway & Oxygen Therapy: Patient Spontanous Breathing and Patient connected to face mask oxygen ? ?Post-op Assessment: Report given to RN and Post -op Vital signs reviewed and stable ? ?Post vital signs: Reviewed and stable ? ?Last Vitals:  ?Vitals Value Taken Time  ?BP 125/72 11/15/21 1048  ?Temp    ?Pulse 71 11/15/21 1054  ?Resp 15 11/15/21 1053  ?SpO2 97 % 11/15/21 1054  ?Vitals shown include unvalidated device data. ? ?Last Pain:  ?Vitals:  ? 11/15/21 0837  ?TempSrc: Temporal  ?PainSc: 4   ?   ? ?  ? ?Complications: No notable events documented. ?

## 2021-11-15 NOTE — Discharge Instructions (Addendum)
?Your surgeon has performed an operation on your lumbar spine (low back) to relieve pressure on one or more nerves. Many times, patients feel better immediately after surgery and can ?overdo it.? Even if you feel well, it is important that you follow these activity guidelines. If you do not let your back heal properly from the surgery, you can increase the chance of a disc herniation and/or return of your symptoms. The following are instructions to help in your recovery once you have been discharged from the hospital. ? ?Activity  ?  ?No bending, lifting, or twisting (?BLT?). Avoid lifting objects heavier than 10 pounds (gallon milk jug).  Where possible, avoid household activities that involve lifting, bending, pushing, or pulling such as laundry, vacuuming, grocery shopping, and childcare. Try to arrange for help from friends and family for these activities while your back heals. ? ?Increase physical activity slowly as tolerated.  Taking short walks is encouraged, but avoid strenuous exercise. Do not jog, run, bicycle, lift weights, or participate in any other exercises unless specifically allowed by your doctor. Avoid prolonged sitting, including car rides. ? ?Talk to your doctor before resuming sexual activity. ? ?You should not drive until cleared by your doctor. ? ?Until released by your doctor, you should not return to work or school.  You should rest at home and let your body heal.  ? ?You may shower two days after your surgery.  After showering, lightly dab your incision dry. Do not take a tub bath or go swimming for 3 weeks, or until approved by your doctor at your follow-up appointment. ? ?If you smoke, we strongly recommend that you quit.  Smoking has been proven to interfere with normal healing in your back and will dramatically reduce the success rate of your surgery. Please contact [facility] (800-QUIT-NOW) and use the resources at www.[facility].com for assistance in stopping smoking. ? ?Surgical  Incision ?  ?If you have a dressing on your incision, you may remove it two days after your surgery. Keep your incision area clean and dry. ? ?Your incision was closed with Dermabond glue. The glue should begin to peel away within about a week. ?Diet          ? ? You may return to your usual diet. Be sure to stay hydrated. ? ?When to Contact Us ? ?Although your surgery and recovery will likely be uneventful, you may have some residual numbness, aches, and pains in your back and/or legs. This is normal and should improve in the next few weeks. ? ?However, should you experience any of the following, contact us immediately: ?New numbness or weakness ?Pain that is progressively getting worse, and is not relieved by your pain medications or rest ?Bleeding, redness, swelling, pain, or drainage from surgical incision ?Chills or flu-like symptoms ?Fever greater than 101.0 F (38.3 C) ?Problems with bowel or bladder functions ?Difficulty breathing or shortness of breath ?Warmth, tenderness, or swelling in your calf ? ?Contact Information ?During office hours (Monday-Friday 9 am to 5 pm), please call your physician at [phone] ?After hours and weekends, please call [phone] and speak with the answering service, who will contact the doctor on call.  If that fails, call the [facility] Operator at [phone] and ask for the Neurosurgery Resident On Call  ?For a life-threatening emergency, call 911  ? ?        AMBULATORY SURGERY  ?       DISCHARGE INSTRUCTIONS ? ? ?The drugs that you were given will stay   not: ? ?Drive an automobile ?Make any legal decisions ?Drink any alcoholic beverage ? ? ?You may resume regular meals tomorrow.  Today it is better to start with liquids and gradually work up to solid foods. ? ?You may eat anything you prefer, but it is better to start with liquids, then soup and crackers, and gradually work up to solid foods. ? ? ?Please notify your  doctor immediately if you have any unusual bleeding, trouble breathing, redness and pain at the surgery site, drainage, fever, or pain not relieved by medication. ? ? ? ?Additional Instructions: ?Please contact your physician with any problems or Same Day Surgery at [phone], Monday through Friday 6 am to 4 pm, or [place] at [facility] number at [phone].  ?

## 2021-11-16 NOTE — Anesthesia Postprocedure Evaluation (Signed)
Anesthesia Post Note ? ?Patient: Martha Oconnor ? ?Procedure(s) Performed: LEFT L5-S1 MICRODISCECTOMY (Left: Back) ? ?Patient location during evaluation: PACU ?Anesthesia Type: General ?Level of consciousness: awake and alert ?Pain management: pain level controlled ?Vital Signs Assessment: post-procedure vital signs reviewed and stable ?Respiratory status: spontaneous breathing, nonlabored ventilation and respiratory function stable ?Cardiovascular status: blood pressure returned to baseline and stable ?Postop Assessment: no apparent nausea or vomiting ?Anesthetic complications: no ? ? ?No notable events documented. ? ? ?Last Vitals:  ?Vitals:  ? 11/15/21 1152 11/15/21 1216  ?BP: 133/81   ?Pulse: 85 82  ?Resp: 16 18  ?Temp: (!) 36.3 ?C (!) 36.3 ?C  ?SpO2: 99% 98%  ?  ?Last Pain:  ?Vitals:  ? 11/15/21 1216  ?TempSrc: Temporal  ?PainSc: 0-No pain  ? ? ?  ?  ?  ?  ?  ?  ? ?Foye Deer ? ? ? ? ?

## 2021-12-03 DIAGNOSIS — F902 Attention-deficit hyperactivity disorder, combined type: Secondary | ICD-10-CM | POA: Diagnosis not present

## 2021-12-03 DIAGNOSIS — F4312 Post-traumatic stress disorder, chronic: Secondary | ICD-10-CM | POA: Diagnosis not present

## 2021-12-03 DIAGNOSIS — F319 Bipolar disorder, unspecified: Secondary | ICD-10-CM | POA: Diagnosis not present

## 2021-12-03 DIAGNOSIS — F411 Generalized anxiety disorder: Secondary | ICD-10-CM | POA: Diagnosis not present

## 2022-01-04 ENCOUNTER — Other Ambulatory Visit: Payer: Self-pay | Admitting: Obstetrics and Gynecology

## 2022-01-04 DIAGNOSIS — N946 Dysmenorrhea, unspecified: Secondary | ICD-10-CM

## 2022-01-04 DIAGNOSIS — Z30011 Encounter for initial prescription of contraceptive pills: Secondary | ICD-10-CM

## 2022-01-25 ENCOUNTER — Encounter: Payer: BC Managed Care – PPO | Admitting: Obstetrics and Gynecology

## 2022-02-15 DIAGNOSIS — Z9889 Other specified postprocedural states: Secondary | ICD-10-CM | POA: Diagnosis not present

## 2022-02-15 DIAGNOSIS — M5416 Radiculopathy, lumbar region: Secondary | ICD-10-CM | POA: Diagnosis not present

## 2022-02-16 ENCOUNTER — Other Ambulatory Visit: Payer: Self-pay | Admitting: Neurosurgery

## 2022-02-16 DIAGNOSIS — Z9889 Other specified postprocedural states: Secondary | ICD-10-CM

## 2022-02-16 DIAGNOSIS — M5416 Radiculopathy, lumbar region: Secondary | ICD-10-CM

## 2022-02-21 DIAGNOSIS — F902 Attention-deficit hyperactivity disorder, combined type: Secondary | ICD-10-CM | POA: Diagnosis not present

## 2022-02-21 DIAGNOSIS — F411 Generalized anxiety disorder: Secondary | ICD-10-CM | POA: Diagnosis not present

## 2022-02-21 DIAGNOSIS — F4312 Post-traumatic stress disorder, chronic: Secondary | ICD-10-CM | POA: Diagnosis not present

## 2022-02-21 DIAGNOSIS — F319 Bipolar disorder, unspecified: Secondary | ICD-10-CM | POA: Diagnosis not present

## 2022-02-22 ENCOUNTER — Ambulatory Visit
Admission: RE | Admit: 2022-02-22 | Discharge: 2022-02-22 | Disposition: A | Discharge status: Home / Self Care | Payer: BC Managed Care – PPO | Source: Ambulatory Visit | Attending: Neurosurgery | Admitting: Neurosurgery

## 2022-02-22 DIAGNOSIS — M5416 Radiculopathy, lumbar region: Secondary | ICD-10-CM | POA: Diagnosis not present

## 2022-02-22 DIAGNOSIS — R200 Anesthesia of skin: Secondary | ICD-10-CM | POA: Diagnosis not present

## 2022-02-22 DIAGNOSIS — Z9889 Other specified postprocedural states: Secondary | ICD-10-CM | POA: Diagnosis not present

## 2022-02-22 DIAGNOSIS — M545 Low back pain, unspecified: Secondary | ICD-10-CM | POA: Diagnosis not present

## 2022-02-22 DIAGNOSIS — M79605 Pain in left leg: Secondary | ICD-10-CM | POA: Diagnosis not present

## 2022-03-04 DIAGNOSIS — M5416 Radiculopathy, lumbar region: Secondary | ICD-10-CM | POA: Diagnosis not present

## 2022-03-04 DIAGNOSIS — M5126 Other intervertebral disc displacement, lumbar region: Secondary | ICD-10-CM | POA: Diagnosis not present

## 2022-03-31 ENCOUNTER — Telehealth: Payer: Self-pay

## 2022-03-31 ENCOUNTER — Ambulatory Visit: Payer: Self-pay | Admitting: Urology

## 2022-03-31 NOTE — Telephone Encounter (Signed)
[name] called in. She reports she had the injection on 03/04/22. She reports 75-85% improvement in her symptoms. She would like to try to return to work and see how she does. 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  I have discussed this with Danielle. We will give her a note to return to work without restrictions and she can follow up as needed. She is agreeable with this plan.  I have faxed the work note to [phone] per her request.

## 2022-04-01 ENCOUNTER — Ambulatory Visit
Admission: RE | Admit: 2022-04-01 | Discharge: 2022-04-01 | Disposition: A | Discharge status: Home / Self Care | Payer: BC Managed Care – PPO | Attending: Urology | Admitting: Urology

## 2022-04-01 ENCOUNTER — Ambulatory Visit (INDEPENDENT_AMBULATORY_CARE_PROVIDER_SITE_OTHER): Payer: BC Managed Care – PPO | Admitting: Urology

## 2022-04-01 ENCOUNTER — Other Ambulatory Visit: Payer: Self-pay | Admitting: Family Medicine

## 2022-04-01 ENCOUNTER — Ambulatory Visit
Admission: RE | Admit: 2022-04-01 | Discharge: 2022-04-01 | Disposition: A | Discharge status: Home / Self Care | Payer: BC Managed Care – PPO | Source: Ambulatory Visit | Attending: Urology | Admitting: Urology

## 2022-04-01 ENCOUNTER — Encounter: Payer: Self-pay | Admitting: Urology

## 2022-04-01 ENCOUNTER — Ambulatory Visit: Payer: BC Managed Care – PPO | Admitting: Urology

## 2022-04-01 VITALS — BP 144/98 | HR 87 | Ht 64.0 in | Wt 184.0 lb

## 2022-04-01 DIAGNOSIS — N3946 Mixed incontinence: Secondary | ICD-10-CM | POA: Diagnosis not present

## 2022-04-01 DIAGNOSIS — N201 Calculus of ureter: Secondary | ICD-10-CM

## 2022-04-01 DIAGNOSIS — N200 Calculus of kidney: Secondary | ICD-10-CM | POA: Diagnosis not present

## 2022-04-01 NOTE — Progress Notes (Signed)
04/01/2022 1:31 PM   [name] 1991/05/12 [id number]  Referring provider: Loura Pardon, MD [address] [place],  [place] [address]  Chief Complaint  Patient presents with   Nephrolithiasis    HPI: 31 y.o. female presents for follow-up visit.  Seen last year after an ED visit for left renal colic secondary to a 5 mm distal ureteral calculus She stopped hurting however did not recall passing a stone.  A follow-up renal ultrasound was obtained which showed resolution of her hydronephrosis and a calculus was not seen on KUB She has recently had lumbar spine surgery for disc disease and may have to undergo additional surgery in the future.  Complains of chronic right back pain She has lower urinary tract symptoms which she thinks may be secondary to her back.  She has frequency, occasional urge incontinence and occasional stress incontinence   PMH: Past Medical History:  Diagnosis Date   ADHD    Anxiety    Clotting disorder (HCC)    Depression    Headache    migraine   Heart murmur    PTSD (post-traumatic stress disorder)     Surgical History: Past Surgical History:  Procedure Laterality Date   LUMBAR LAMINECTOMY/DECOMPRESSION MICRODISCECTOMY Left 11/15/2021   Procedure: LEFT L5-S1 MICRODISCECTOMY;  Surgeon: Venetia Night, MD;  Location: [facility] ORS;  Service: Neurosurgery;  Laterality: Left;   TUBAL LIGATION      Home Medications:  Allergies as of 04/01/2022       Reactions   Ceclor [cefaclor] Other (See Comments)   Unknown (rxn as young child)        Medication List        Accurate as of April 01, 2022  1:31 PM. If you have any questions, ask your nurse or doctor.          celecoxib 100 MG capsule Commonly known as: CeleBREX Take 1 capsule (100 mg total) by mouth 2 (two) times daily.   cyclobenzaprine 10 MG tablet Commonly known as: FLEXERIL Take 10 mg by mouth 3 (three) times daily as needed for muscle spasms.   doxycycline 100 MG  capsule Commonly known as: MONODOX Take 100 mg by mouth daily as needed (Skin cond.).   gabapentin 300 MG capsule Commonly known as: NEURONTIN Take 300 mg by mouth 2 (two) times daily.   Levonorgestrel-Ethinyl Estradiol 0.15-0.03 &0.01 MG tablet Commonly known as: AMETHIA Take 1 tablet by mouth at bedtime.   senna 8.6 MG Tabs tablet Commonly known as: SENOKOT Take 1 tablet (8.6 mg total) by mouth daily as needed for mild constipation.   sertraline 50 MG tablet Commonly known as: ZOLOFT Take 50 mg by mouth daily.   Vyvanse 40 MG capsule Generic drug: lisdexamfetamine Take 40 mg by mouth every morning.        Allergies:  Allergies  Allergen Reactions   Ceclor [Cefaclor] Other (See Comments)    Unknown (rxn as young child)    Family History: No family history on file.  Social History:  reports that she quit smoking about 8 years ago. Her smoking use included cigarettes. She has never used smokeless tobacco. She reports that she does not currently use alcohol. She reports current drug use. Drug: Marijuana.   Physical Exam: BP (!) 144/98   Pulse 87   Ht 5\' 4"  (1.626 m)   Wt 184 lb (83.5 kg)   LMP 03/27/2022   BMI 31.58 kg/m   Constitutional:  Alert and oriented, No acute distress. HEENT: [place]  AT Respiratory: Normal respiratory effort, no increased work of breathing. Psychiatric: Normal mood and affect.   Pertinent Imaging: Images of a KUB performed today were personally reviewed and interpreted.  There is a 3-4 mm left upper pole renal calcification most likely represent a stone.  CT last year showed a 1 mm left upper pole renal calculus   Assessment & Plan:    1.  Left nephrolithiasis Interval stone growth.  Due to her back issues she has been relatively immobilized for several months which may account for the stone growth. Recommend a follow-up KUB 6 months.  Discussed metabolic evaluation at that visit  2.  Mixed urinary incontinence She is interested in  pursuing pelvic floor PT after recovery from her back surgery   Riki Altes, MD  [facility] [address], [address] [place], [place] [address] [phone]

## 2022-04-05 ENCOUNTER — Encounter: Payer: Self-pay | Admitting: Urology

## 2022-05-05 DIAGNOSIS — F319 Bipolar disorder, unspecified: Secondary | ICD-10-CM | POA: Diagnosis not present

## 2022-05-05 DIAGNOSIS — F4312 Post-traumatic stress disorder, chronic: Secondary | ICD-10-CM | POA: Diagnosis not present

## 2022-05-05 DIAGNOSIS — F411 Generalized anxiety disorder: Secondary | ICD-10-CM | POA: Diagnosis not present

## 2022-05-05 DIAGNOSIS — F902 Attention-deficit hyperactivity disorder, combined type: Secondary | ICD-10-CM | POA: Diagnosis not present

## 2022-05-19 ENCOUNTER — Other Ambulatory Visit: Payer: Self-pay

## 2022-05-19 ENCOUNTER — Telehealth: Payer: Self-pay | Admitting: Obstetrics and Gynecology

## 2022-05-19 DIAGNOSIS — N946 Dysmenorrhea, unspecified: Secondary | ICD-10-CM

## 2022-05-19 DIAGNOSIS — Z30011 Encounter for initial prescription of contraceptive pills: Secondary | ICD-10-CM

## 2022-05-19 MED ORDER — LEVONORGEST-ETH ESTRAD 91-DAY 0.15-0.03 &0.01 MG PO TABS: 1.0000 | ORAL_TABLET | Freq: Every day | ORAL | 0 refills | Status: DC

## 2022-05-19 NOTE — Telephone Encounter (Signed)
Patient called requesting refill on RX for [facility]. Last physical 09/27/2021.

## 2022-06-08 ENCOUNTER — Encounter: Payer: Self-pay | Admitting: Physician Assistant

## 2022-06-08 ENCOUNTER — Ambulatory Visit (INDEPENDENT_AMBULATORY_CARE_PROVIDER_SITE_OTHER): Payer: BC Managed Care – PPO | Admitting: Physician Assistant

## 2022-06-08 ENCOUNTER — Ambulatory Visit: Payer: Self-pay

## 2022-06-08 VITALS — BP 119/81 | HR 111 | Temp 98.7°F | Ht 64.02 in | Wt 172.5 lb

## 2022-06-08 DIAGNOSIS — R197 Diarrhea, unspecified: Secondary | ICD-10-CM | POA: Diagnosis not present

## 2022-06-08 DIAGNOSIS — K529 Noninfective gastroenteritis and colitis, unspecified: Secondary | ICD-10-CM

## 2022-06-08 DIAGNOSIS — R112 Nausea with vomiting, unspecified: Secondary | ICD-10-CM | POA: Diagnosis not present

## 2022-06-08 MED ORDER — ONDANSETRON HCL 4 MG PO TABS: 4.0000 mg | ORAL_TABLET | Freq: Three times a day (TID) | ORAL | 0 refills | Status: DC | PRN

## 2022-06-08 NOTE — Patient Instructions (Signed)
Please do the following for your gastroenteritis :  Drink plenty of water and you can use Pedialyte to help rehydrate  Follow a bland diet until you are feeling better then slowly reintroduce your regular foods ( boiled chicken, applesauce, bananas, plain cooked rice, toast, etc) You can take half doses of Imodium to help reduce the diarrhea but be careful with this as we don't want to cause constipation or other complications  If you feel like you are still not able to drink or eat regularly, even with using the Zofran, please let us know as we may need to send you to the ED for IV fluids

## 2022-06-08 NOTE — Telephone Encounter (Signed)
  Chief Complaint: diarrhea and vomiting Symptoms: moderate watery diarrhea x 5 since 0230 this am and now dry heaving, dry mouth, weakness Frequency: started 0230  Pertinent Negatives: Patient denies abd. Pain, fever Disposition: [] ED /[] Urgent Care (no appt availability in office) / [x] Appointment(In office/virtual)/ []  [place] [facility]/ [] Home Care/ [] Refused Recommended Disposition /[] [place] [facility]/ []  Follow-up with PCP Additional Notes: n/a Reason for Disposition  [1] MODERATE diarrhea (e.g., 4-6 times / day more than normal) AND [2] present > 48 hours (2 days)  Answer Assessment - Initial Assessment Questions 1. DIARRHEA SEVERITY: "How bad is the diarrhea?" "How many more stools have you had in the past 24 hours than normal?"    - NO DIARRHEA (SCALE 0)   - MILD (SCALE 1-3): Few loose or mushy BMs; increase of 1-3 stools over normal daily number of stools; mild increase in ostomy output.   -  MODERATE (SCALE 4-7): Increase of 4-6 stools daily over normal; moderate increase in ostomy output.   -  SEVERE (SCALE 8-10; OR "WORST POSSIBLE"): Increase of 7 or more stools daily over normal; moderate increase in ostomy output; incontinence.     moderate 2. ONSET: "When did the diarrhea begin?"      0230 3. BM CONSISTENCY: "How loose or watery is the diarrhea?"      watery 4. VOMITING: "Are you also vomiting?" If Yes, ask: "How many times in the past 24 hours?"      Yes 3 times now nauseated and dry heaving 5. ABDOMEN PAIN: "Are you having any abdomen pain?" If Yes, ask: "What does it feel like?" (e.g., crampy, dull, intermittent, constant)      no 6. ABDOMEN PAIN SEVERITY: If present, ask: "How bad is the pain?"  (e.g., Scale 1-10; mild, moderate, or severe)   - MILD (1-3): doesn't interfere with normal activities, abdomen soft and not tender to touch    - MODERATE (4-7): interferes with normal activities or awakens from sleep, abdomen tender to touch    - SEVERE (8-10):  excruciating pain, doubled over, unable to do any normal activities       no 7. ORAL INTAKE: If vomiting, "Have you been able to drink liquids?" "How much liquids have you had in the past 24 hours?"     yes 8. HYDRATION: "Any signs of dehydration?" (e.g., dry mouth [not just dry lips], too weak to stand, dizziness, new weight loss) "When did you last urinate?"     Dry mouth , weakness-0630 9. EXPOSURE: "Have you traveled to a foreign country recently?" "Have you been exposed to anyone with diarrhea?" "Could you have eaten any food that was spoiled?"     N/a- 10. ANTIBIOTIC USE: "Are you taking antibiotics now or have you taken antibiotics in the past 2 months?"       no 11. OTHER SYMPTOMS: "Do you have any other symptoms?" (e.g., fever, blood in stool)       no 12. PREGNANCY: "Is there any chance you are pregnant?" "When was your last menstrual period?"       N/a  Protocols used: [facility]

## 2022-06-08 NOTE — Progress Notes (Signed)
Acute Office Visit   Patient: Martha Oconnor   DOB: Oct 04, 1990   31 y.o. Female  MRN: [id number] Visit Date: 06/08/2022  Today's healthcare provider: Dani Gobble Gaelen Brager, PA-C  Introduced myself to the patient as a Journalist, newspaper and provided education on APPs in clinical practice.    Chief Complaint  Patient presents with   Vomiting    Since 2:30am this morning.    Diarrhea    Has had 7 to 10 bm since 2:30am   Subjective    Diarrhea  Associated symptoms include chills, coughing, headaches and vomiting. Pertinent negatives include no fever or myalgias.   HPI     Vomiting    Additional comments: Since 2:30am this morning.         Diarrhea    Additional comments: Has had 7 to 10 bm since 2:30am      Last edited by Jerelene Redden, CMA on 06/08/2022 11:09 AM.       Nausea, vomiting and diarrhea Started suddenly at 2:30 AM this morning  Reports she woke up and had nausea, vomiting  She states she vomited about 3 times  About 90 minutes after this she started having diarrhea with liquid consistency She has since had several bouts of liquid diarrhea and feels like she can't tolerate PO intake She reports fatigue and tiredness Reports some congestion and productive cough for the past few weeks prior to this starting - reports she has had this almost every year between Aug-Oct and April-May  She states she is usually told she has bronchitis during these events She denies recent abx use  Reports a few weeks ago she noticed she had white pockets on her throat but these resolved, reports associated sore throat but no fever.  She has not eaten anything unusual- went to [place] for her [place] yesterday She denies recent sick contacts that she is aware of but she works in a [employer]  Reports she recently lost her husband very suddenly and traumatically  She is talking to her pastor but is not seeing grief counseling or therapist  Does not appear to be taking any  antidepressants- has script for Sertraline from march this year   Medications: Outpatient Medications Prior to Visit  Medication Sig   Levonorgestrel-Ethinyl Estradiol (AMETHIA) 0.15-0.03 &0.01 MG tablet Take 1 tablet by mouth at bedtime.   VYVANSE 40 MG capsule Take 40 mg by mouth every morning.   cyclobenzaprine (FLEXERIL) 10 MG tablet Take 10 mg by mouth 3 (three) times daily as needed for muscle spasms. (Patient not taking: Reported on 06/08/2022)   [DISCONTINUED] celecoxib (CELEBREX) 100 MG capsule Take 1 capsule (100 mg total) by mouth 2 (two) times daily. (Patient not taking: Reported on 06/08/2022)   [DISCONTINUED] doxycycline (MONODOX) 100 MG capsule Take 100 mg by mouth daily as needed (Skin cond.). (Patient not taking: Reported on 06/08/2022)   [DISCONTINUED] gabapentin (NEURONTIN) 300 MG capsule Take 300 mg by mouth 2 (two) times daily. (Patient not taking: Reported on 06/08/2022)   [DISCONTINUED] senna (SENOKOT) 8.6 MG TABS tablet Take 1 tablet (8.6 mg total) by mouth daily as needed for mild constipation. (Patient not taking: Reported on 06/08/2022)   [DISCONTINUED] sertraline (ZOLOFT) 50 MG tablet Take 50 mg by mouth daily. (Patient not taking: Reported on 06/08/2022)   No facility-administered medications prior to visit.    Review of Systems  Constitutional:  Positive for chills, diaphoresis and fatigue. Negative for fever.  HENT:  Positive for congestion. Negative for ear pain and sore throat.   Respiratory:  Positive for cough.   Gastrointestinal:  Positive for diarrhea, nausea and vomiting.  Musculoskeletal:  Positive for back pain (chronic back pain). Negative for myalgias.  Neurological:  Positive for headaches. Negative for dizziness.       Objective    BP 119/81   Pulse (!) 111   Temp 98.7 F (37.1 C) (Oral)   Ht 5' 4.02" (1.626 m)   Wt 172 lb 8 oz (78.2 kg)   SpO2 98%   BMI 29.59 kg/m    Physical Exam Vitals reviewed.  Constitutional:      General:  She is awake.     Appearance: Normal appearance. She is well-developed and well-groomed.  HENT:     Head: Normocephalic and atraumatic.     Mouth/Throat:     Lips: Pink.     Pharynx: Oropharynx is clear. Uvula midline. No pharyngeal swelling, oropharyngeal exudate or posterior oropharyngeal erythema.     Tonsils: No tonsillar exudate or tonsillar abscesses.  Eyes:     General: Lids are normal. Gaze aligned appropriately.        Right eye: No discharge.        Left eye: No discharge.     Extraocular Movements: Extraocular movements intact.     Conjunctiva/sclera: Conjunctivae normal.  Cardiovascular:     Rate and Rhythm: Regular rhythm. Tachycardia present.     Pulses: Normal pulses.          Radial pulses are 2+ on the right side and 2+ on the left side.     Heart sounds: Normal heart sounds. No murmur heard.    No friction rub. No gallop.  Pulmonary:     Effort: Pulmonary effort is normal.     Breath sounds: Normal breath sounds. No decreased air movement. No decreased breath sounds, wheezing, rhonchi or rales.  Abdominal:     General: Abdomen is flat. Bowel sounds are decreased.     Palpations: Abdomen is soft.     Tenderness: There is abdominal tenderness in the left lower quadrant.     Comments: Reports mild LLQ tenderness   Musculoskeletal:     Right lower leg: No edema.     Left lower leg: No edema.  Lymphadenopathy:     Head:     Right side of head: No submental, submandibular or preauricular adenopathy.     Left side of head: No submental, submandibular or preauricular adenopathy.     Upper Body:     Right upper body: No supraclavicular adenopathy.     Left upper body: No supraclavicular adenopathy.  Neurological:     Mental Status: She is alert.  Psychiatric:        Attention and Perception: Attention normal.        Mood and Affect: Mood normal. Affect is tearful.        Speech: Speech normal.        Behavior: Behavior normal. Behavior is cooperative.           No results found for any visits on 06/08/22.  Assessment & Plan      Return in about 6 weeks (around 07/20/2022) for Depression.       Problem List Items Addressed This Visit       Digestive   Nausea, vomiting and diarrhea - Primary    Acute, new concern Reports nausea and vomiting early this AM followed by multiple bouts of  diarrhea She states she is unable to tolerate PO intake due to nausea  She is mildly tachycardic and feels weak today in office Will provide Zofran 4 mg to assist with nausea and PO intake- recommend increasing water intake, can use Pedialyte for electrolyte replenishment Discussed various bacterial and viral etiologies that could be causing symptoms She has not had recent Abx so less concerned for c.dif at this time Recommend using half doses of Imodium as needed to help reduce diarrhea If she is still unable to tolerate PO intake and feels worse she is aware she may need to seek ED assistance for IV fluids  Reviewed bland diet until she is able to tolerate her regular diet Follow up as needed for persistent or progressing symptoms       Relevant Medications   ondansetron (ZOFRAN) 4 MG tablet     Return in about 6 weeks (around 07/20/2022) for Depression.   I, Malichi Palardy E Karriem Muench, PA-C, have reviewed all documentation for this visit. The documentation on 06/09/22 for the exam, diagnosis, procedures, and orders are all accurate and complete.   Jacquelin Hawking, MHS, PA-C [facility]

## 2022-06-09 DIAGNOSIS — R112 Nausea with vomiting, unspecified: Secondary | ICD-10-CM | POA: Insufficient documentation

## 2022-06-09 NOTE — Assessment & Plan Note (Signed)
Acute, new concern Reports nausea and vomiting early this AM followed by multiple bouts of diarrhea She states she is unable to tolerate PO intake due to nausea  She is mildly tachycardic and feels weak today in office Will provide Zofran 4 mg to assist with nausea and PO intake- recommend increasing water intake, can use Pedialyte for electrolyte replenishment Discussed various bacterial and viral etiologies that could be causing symptoms She has not had recent Abx so less concerned for c.dif at this time Recommend using half doses of Imodium as needed to help reduce diarrhea If she is still unable to tolerate PO intake and feels worse she is aware she may need to seek ED assistance for IV fluids  Reviewed bland diet until she is able to tolerate her regular diet Follow up as needed for persistent or progressing symptoms

## 2022-06-22 ENCOUNTER — Telehealth (INDEPENDENT_AMBULATORY_CARE_PROVIDER_SITE_OTHER): Payer: BC Managed Care – PPO | Admitting: Obstetrics and Gynecology

## 2022-06-22 ENCOUNTER — Encounter: Payer: Self-pay | Admitting: Obstetrics and Gynecology

## 2022-06-22 DIAGNOSIS — Z30011 Encounter for initial prescription of contraceptive pills: Secondary | ICD-10-CM | POA: Diagnosis not present

## 2022-06-22 DIAGNOSIS — N946 Dysmenorrhea, unspecified: Secondary | ICD-10-CM

## 2022-06-22 MED ORDER — LEVONORGEST-ETH ESTRAD 91-DAY 0.15-0.03 &0.01 MG PO TABS: 1.0000 | ORAL_TABLET | Freq: Every day | ORAL | 0 refills | Status: DC
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
## 2022-07-05 DIAGNOSIS — F902 Attention-deficit hyperactivity disorder, combined type: Secondary | ICD-10-CM | POA: Diagnosis not present

## 2022-07-05 DIAGNOSIS — F411 Generalized anxiety disorder: Secondary | ICD-10-CM | POA: Diagnosis not present

## 2022-07-05 DIAGNOSIS — F4312 Post-traumatic stress disorder, chronic: Secondary | ICD-10-CM | POA: Diagnosis not present

## 2022-07-05 DIAGNOSIS — F319 Bipolar disorder, unspecified: Secondary | ICD-10-CM | POA: Diagnosis not present

## 2022-07-19 ENCOUNTER — Encounter: Payer: Self-pay | Admitting: Nurse Practitioner

## 2022-07-29 ENCOUNTER — Ambulatory Visit: Payer: BC Managed Care – PPO | Admitting: Physician Assistant

## 2022-08-02 ENCOUNTER — Ambulatory Visit: Payer: BC Managed Care – PPO | Admitting: Nurse Practitioner

## 2022-08-29 DIAGNOSIS — M51369 Other intervertebral disc degeneration, lumbar region without mention of lumbar back pain or lower extremity pain: Secondary | ICD-10-CM

## 2022-08-29 HISTORY — DX: Other intervertebral disc degeneration, lumbar region without mention of lumbar back pain or lower extremity pain: M51.369

## 2022-08-30 ENCOUNTER — Telehealth: Payer: BC Managed Care – PPO | Admitting: Urgent Care

## 2022-08-30 ENCOUNTER — Ambulatory Visit: Payer: Self-pay | Admitting: *Deleted

## 2022-08-30 ENCOUNTER — Other Ambulatory Visit: Payer: Self-pay

## 2022-08-30 ENCOUNTER — Encounter: Payer: Self-pay | Admitting: Obstetrics and Gynecology

## 2022-08-30 DIAGNOSIS — N946 Dysmenorrhea, unspecified: Secondary | ICD-10-CM

## 2022-08-30 DIAGNOSIS — N3000 Acute cystitis without hematuria: Secondary | ICD-10-CM

## 2022-08-30 DIAGNOSIS — Z30011 Encounter for initial prescription of contraceptive pills: Secondary | ICD-10-CM

## 2022-08-30 MED ORDER — LEVONORGEST-ETH ESTRAD 91-DAY 0.15-0.03 &0.01 MG PO TABS: 1.0000 | ORAL_TABLET | Freq: Every day | ORAL | 0 refills | Status: DC

## 2022-08-30 MED ORDER — NITROFURANTOIN MONOHYD MACRO 100 MG PO CAPS: 100.0000 mg | ORAL_CAPSULE | Freq: Two times a day (BID) | ORAL | 0 refills | Status: AC

## 2022-08-30 NOTE — Telephone Encounter (Signed)
  Chief Complaint: urinary frequency Symptoms: frequency of urination Frequency: symptom started yesterday Pertinent Negatives: Patient denies any other symptoms Disposition: [] ED /[] Urgent Care (no appt availability in office) / [] Appointment(In office/virtual)/ [x]  [place] [facility]/ [] Home Care/ [] Refused Recommended Disposition /[] [place] [facility]/ []  Follow-up with PCP Additional Notes: No open appointment- patient wants to be seen as soon as possible- virtual UC scheduled

## 2022-08-30 NOTE — Telephone Encounter (Signed)
Summary: Frequency to Urinate advice   Pt is calling to report that she believes she has a UTI- with frequency to urinate. There are no available appts until Thursday. Pt would like to know what she can do. Please advise     Reason for Disposition  Urinating more frequently than usual (i.e., frequency)  Answer Assessment - Initial Assessment Questions 1. SYMPTOM: "What's the main symptom you're concerned about?" (e.g., frequency, incontinence)     frequency 2. ONSET: "When did the  frequency  start?"     yesterday 3. PAIN: "Is there any pain?" If Yes, ask: "How bad is it?" (Scale: 1-10; mild, moderate, severe)     no 4. CAUSE: "What do you think is causing the symptoms?"     UTI 5. OTHER SYMPTOMS: "Do you have any other symptoms?" (e.g., blood in urine, fever, flank pain, pain with urination)     no  Protocols used: Urinary Symptoms-A-AH

## 2022-08-30 NOTE — Patient Instructions (Signed)
  Martha Oconnor, thank you for joining Chaney Malling, PA for today's virtual visit.  While this provider is not your primary care provider (PCP), if your PCP is located in our provider database this encounter information will be shared with them immediately following your visit.   [facility] account gives you access to today's visit and all your visits, tests, and labs performed at [facility] " click here if you don't have a [facility] account or go to [email or web address]  Consent: (Patient) Martha Oconnor provided verbal consent for this virtual visit at the beginning of the encounter.  Current Medications:  Current Outpatient Medications:    nitrofurantoin, macrocrystal-monohydrate, (MACROBID) 100 MG capsule, Take 1 capsule (100 mg total) by mouth 2 (two) times daily for 5 days., Disp: 10 capsule, Rfl: 0   cyclobenzaprine (FLEXERIL) 10 MG tablet, Take 10 mg by mouth 3 (three) times daily as needed for muscle spasms. (Patient not taking: Reported on 06/08/2022), Disp: , Rfl:    Levonorgestrel-Ethinyl Estradiol (AMETHIA) 0.15-0.03 &0.01 MG tablet, Take 1 tablet by mouth at bedtime., Disp: 84 tablet, Rfl: 0   ondansetron (ZOFRAN) 4 MG tablet, Take 1 tablet (4 mg total) by mouth every 8 (eight) hours as needed for nausea or vomiting., Disp: 20 tablet, Rfl: 0   VYVANSE 40 MG capsule, Take 40 mg by mouth every morning., Disp: , Rfl:    Medications ordered in this encounter:  Meds ordered this encounter  Medications   nitrofurantoin, macrocrystal-monohydrate, (MACROBID) 100 MG capsule    Sig: Take 1 capsule (100 mg total) by mouth 2 (two) times daily for 5 days.    Dispense:  10 capsule    Refill:  0    Order Specific Question:   Supervising Provider    Answer:   Chase Picket [id number]     *If you need refills on other medications prior to your next appointment, please contact your pharmacy*  Follow-Up: Call back or seek an in-person  evaluation if the symptoms worsen or if the condition fails to improve as anticipated.  [place] [phone]  Other Instructions Your symptoms are consistent with a urinary tract infection. Start taking the antibiotic twice daily until completed. You can continue your OTC AZO for three days.  Take on an as-needed basis. Drink plenty of water. Monitor for any change in or worsening symptoms; fever, hematuria or flank pain would warrant an urgent care or emergency room evaluation.   If you have been instructed to have an in-person evaluation today at a local Urgent Care facility, please use the link below. It will take you to a list of all of our available [facility], including address, phone number and hours of operation. Please do not delay care.  [facility]  If you or a family member do not have a primary care provider, use the link below to schedule a visit and establish care. When you choose a [facility] primary care physician or advanced practice provider, you gain a long-term partner in health. Find a Primary Care Provider  Learn more about [place]'s in-office and virtual care options: [facility]
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
## 2022-09-06 ENCOUNTER — Telehealth: Payer: Self-pay

## 2022-09-06 NOTE — Telephone Encounter (Signed)
Pt calling; was seen last week by PCP for UTI; has finished antibx but is still irritated down there; urinary frequency is gone; doesn't feel right down there.  [phone]  Pt states is itchy irritated inside vagina; adv monistat 7 and if doesn't help to call to be seen.

## 2022-10-05 ENCOUNTER — Other Ambulatory Visit: Payer: Self-pay | Admitting: Nurse Practitioner

## 2022-10-05 ENCOUNTER — Encounter: Payer: Self-pay | Admitting: Nurse Practitioner

## 2022-10-05 DIAGNOSIS — Z20822 Contact with and (suspected) exposure to covid-19: Secondary | ICD-10-CM | POA: Diagnosis not present

## 2022-10-05 DIAGNOSIS — J111 Influenza due to unidentified influenza virus with other respiratory manifestations: Secondary | ICD-10-CM | POA: Diagnosis not present

## 2022-10-05 DIAGNOSIS — M5416 Radiculopathy, lumbar region: Secondary | ICD-10-CM | POA: Insufficient documentation

## 2022-10-05 DIAGNOSIS — Z9889 Other specified postprocedural states: Secondary | ICD-10-CM | POA: Insufficient documentation

## 2022-10-05 DIAGNOSIS — M5136 Other intervertebral disc degeneration, lumbar region: Secondary | ICD-10-CM | POA: Insufficient documentation

## 2022-10-05 DIAGNOSIS — R059 Cough, unspecified: Secondary | ICD-10-CM | POA: Diagnosis not present

## 2022-10-05 NOTE — Patient Instructions (Incomplete)

## 2022-10-06 ENCOUNTER — Ambulatory Visit: Payer: BC Managed Care – PPO | Admitting: Nurse Practitioner

## 2022-10-07 ENCOUNTER — Ambulatory Visit: Payer: BC Managed Care – PPO | Admitting: Urology

## 2022-10-21 ENCOUNTER — Ambulatory Visit: Payer: BC Managed Care – PPO | Admitting: Urology

## 2022-11-10 ENCOUNTER — Ambulatory Visit: Payer: BC Managed Care – PPO | Admitting: Physician Assistant

## 2022-11-16 ENCOUNTER — Encounter: Payer: Self-pay | Admitting: Obstetrics and Gynecology

## 2022-11-16 ENCOUNTER — Ambulatory Visit (INDEPENDENT_AMBULATORY_CARE_PROVIDER_SITE_OTHER): Payer: Managed Care, Other (non HMO) | Admitting: Obstetrics and Gynecology

## 2022-11-16 VITALS — BP 138/93 | HR 93 | Ht 64.0 in | Wt 178.4 lb

## 2022-11-16 DIAGNOSIS — Z30011 Encounter for initial prescription of contraceptive pills: Secondary | ICD-10-CM

## 2022-11-16 DIAGNOSIS — Z01419 Encounter for gynecological examination (general) (routine) without abnormal findings: Secondary | ICD-10-CM

## 2022-11-16 DIAGNOSIS — N946 Dysmenorrhea, unspecified: Secondary | ICD-10-CM

## 2022-11-16 MED ORDER — LEVONORGEST-ETH ESTRAD 91-DAY 0.15-0.03 &0.01 MG PO TABS: 1.0000 | ORAL_TABLET | Freq: Every day | ORAL | 3 refills | Status: DC

## 2022-11-16 NOTE — Progress Notes (Signed)
Patients presents for annual exam today. She states doing well with current daily OCP's. She is up to date on pap smear. No additional questions or concerns at this time.
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
## 2022-11-18 ENCOUNTER — Ambulatory Visit: Payer: Self-pay | Admitting: *Deleted

## 2022-11-18 NOTE — Telephone Encounter (Signed)
  Chief Complaint: elevated BP with symptoms Symptoms: headache, blurred vision Frequency: started yesterday/today Pertinent Negatives: Patient denies chest pain, difficulty breathing Disposition: [x] ED /[] Urgent Care (no appt availability in office) / [] Appointment(In office/virtual)/ []  [facility]/ [] Home Care/ [] Refused Recommended Disposition /[] [facility]/ []  Follow-up with PCP Additional Notes: Patient advised ED

## 2022-11-18 NOTE — Telephone Encounter (Signed)
Called patient left voicemail to call office back to schedule her with another provider and put in [place].

## 2022-11-18 NOTE — Telephone Encounter (Signed)
Reason for Disposition  [id number] Systolic BP  >= 0000000 OR Diastolic >= [id number] AND [id number] cardiac (e.g., breathing difficulty, chest pain) or neurologic symptoms (e.g., new-onset blurred or double vision, unsteady gait)  Answer Assessment - Initial Assessment Questions 1. BLOOD PRESSURE: "What is the blood pressure?" "Did you take at least two measurements 5 minutes apart?"     170/110, several checks this morning- 149/104 was the lowest 2. ONSET: "When did you take your blood pressure?"     10:45 3. HOW: "How did you take your blood pressure?" (e.g., automatic home BP monitor, visiting nurse)     Automatic cuff- arm 4. HISTORY: "Do you have a history of high blood pressure?"     No- borderline at office 5. MEDICINES: "Are you taking any medicines for blood pressure?" "Have you missed any doses recently?"     no 6. OTHER SYMPTOMS: "Do you have any symptoms?" (e.g., blurred vision, chest pain, difficulty breathing, headache, weakness)     Blurred vision, headache  Protocols used: Blood Pressure - High-A-AH

## 2022-11-21 NOTE — Telephone Encounter (Signed)
Scheduled patient appointment for Friday November 25, 2022 @ 1:00 pm.

## 2022-11-25 ENCOUNTER — Ambulatory Visit: Payer: BC Managed Care – PPO | Admitting: Nurse Practitioner

## 2022-12-02 ENCOUNTER — Ambulatory Visit: Payer: BC Managed Care – PPO | Admitting: Urology

## 2022-12-09 ENCOUNTER — Emergency Department: Payer: Managed Care, Other (non HMO)

## 2022-12-09 ENCOUNTER — Other Ambulatory Visit: Payer: Self-pay

## 2022-12-09 ENCOUNTER — Emergency Department
Admission: EM | Admit: 2022-12-09 | Discharge: 2022-12-09 | Disposition: A | Discharge status: Home / Self Care | Payer: Managed Care, Other (non HMO) | Attending: Emergency Medicine | Admitting: Emergency Medicine

## 2022-12-09 ENCOUNTER — Encounter: Payer: Self-pay | Admitting: Intensive Care

## 2022-12-09 DIAGNOSIS — Y92410 Unspecified street and highway as the place of occurrence of the external cause: Secondary | ICD-10-CM | POA: Insufficient documentation

## 2022-12-09 DIAGNOSIS — S161XXA Strain of muscle, fascia and tendon at neck level, initial encounter: Secondary | ICD-10-CM | POA: Diagnosis not present

## 2022-12-09 DIAGNOSIS — M545 Low back pain, unspecified: Secondary | ICD-10-CM | POA: Diagnosis present

## 2022-12-09 DIAGNOSIS — S39012A Strain of muscle, fascia and tendon of lower back, initial encounter: Secondary | ICD-10-CM | POA: Diagnosis not present

## 2022-12-09 MED ORDER — CYCLOBENZAPRINE HCL 10 MG PO TABS: 10.0000 mg | ORAL_TABLET | Freq: Three times a day (TID) | ORAL | 0 refills | Status: DC | PRN
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
## 2022-12-12 ENCOUNTER — Telehealth: Payer: Self-pay

## 2022-12-12 ENCOUNTER — Ambulatory Visit: Payer: BC Managed Care – PPO | Admitting: Nurse Practitioner

## 2022-12-12 NOTE — Transitions of Care (Post Inpatient/ED Visit) (Signed)
   12/12/2022  Name: Martha Oconnor MRN: [id number] DOB: 1991-03-30  Today's TOC FU Call Status: Today's TOC FU Call Status:: Unsuccessful Call (2nd Attempt)  Attempted to reach the patient regarding the most recent Inpatient/ED visit.  Follow Up Plan: Additional outreach attempts will be made to reach the patient to complete the Transitions of Care (Post Inpatient/ED visit) call.   Signature Leward Quan, CMA, AAMA

## 2022-12-19 NOTE — Transitions of Care (Post Inpatient/ED Visit) (Unsigned)
   12/19/2022  Name: JAKYAH BRADBY MRN: [id number] DOB: 11-15-1990  Today's TOC FU Call Status: Today's TOC FU Call Status:: Unsuccessful Call (2nd Attempt)  Attempted to reach the patient regarding the most recent Inpatient/ED visit.  Follow Up Plan: Additional outreach attempts will be made to reach the patient to complete the Transitions of Care (Post Inpatient/ED visit) call.   Signature Leward Quan, CMA, AAMA

## 2022-12-20 NOTE — Transitions of Care (Post Inpatient/ED Visit) (Signed)
   12/20/2022  Name: Martha Oconnor MRN: [id number] DOB: 12-21-1990  Today's TOC FU Call Status: Today's TOC FU Call Status:: Unsuccessful Call (3rd Attempt) Unsuccessful Call (1st Attempt) Date: 12/12/22 Unsuccessful Call (2nd Attempt) Date: 12/19/22  Attempted to reach the patient regarding the most recent Inpatient/ED visit.  Follow Up Plan: No further outreach attempts will be made at this time. We have been unable to contact the patient.  Signature Leward Quan, CMA, AAMA

## 2022-12-23 ENCOUNTER — Ambulatory Visit: Payer: BC Managed Care – PPO | Admitting: Nurse Practitioner

## 2023-02-13 ENCOUNTER — Other Ambulatory Visit: Payer: Self-pay | Admitting: Obstetrics and Gynecology

## 2023-02-13 DIAGNOSIS — N946 Dysmenorrhea, unspecified: Secondary | ICD-10-CM

## 2023-02-13 DIAGNOSIS — Z30011 Encounter for initial prescription of contraceptive pills: Secondary | ICD-10-CM

## 2023-04-12 ENCOUNTER — Encounter: Payer: Self-pay | Admitting: Emergency Medicine

## 2023-04-12 ENCOUNTER — Emergency Department: Payer: MEDICAID

## 2023-04-12 ENCOUNTER — Other Ambulatory Visit: Payer: Self-pay

## 2023-04-12 ENCOUNTER — Emergency Department
Admission: EM | Admit: 2023-04-12 | Discharge: 2023-04-12 | Disposition: A | Discharge status: Home / Self Care | Payer: MEDICAID | Attending: Emergency Medicine | Admitting: Emergency Medicine

## 2023-04-12 DIAGNOSIS — R109 Unspecified abdominal pain: Secondary | ICD-10-CM | POA: Insufficient documentation

## 2023-04-12 DIAGNOSIS — D72829 Elevated white blood cell count, unspecified: Secondary | ICD-10-CM | POA: Insufficient documentation

## 2023-04-12 LAB — CBC WITH DIFFERENTIAL/PLATELET
Abs Immature Granulocytes: 0.02 10*3/uL (ref 0.00–0.07)
Basophils Absolute: 0.1 10*3/uL (ref 0.0–0.1)
Basophils Relative: 1 %
Eosinophils Absolute: 0 10*3/uL (ref 0.0–0.5)
Eosinophils Relative: 0 %
HCT: 42.2 % (ref 36.0–46.0)
Hemoglobin: 13.8 g/dL (ref 12.0–15.0)
Immature Granulocytes: 0 %
Lymphocytes Relative: 36 %
Lymphs Abs: 4 10*3/uL (ref 0.7–4.0)
MCH: 30.9 pg (ref 26.0–34.0)
MCHC: 32.7 g/dL (ref 30.0–36.0)
MCV: 94.4 fL (ref 80.0–100.0)
Monocytes Absolute: 0.7 10*3/uL (ref 0.1–1.0)
Monocytes Relative: 6 %
Neutro Abs: 6.4 10*3/uL (ref 1.7–7.7)
Neutrophils Relative %: 57 %
Platelets: 307 10*3/uL (ref 150–400)
RBC: 4.47 MIL/uL (ref 3.87–5.11)
RDW: 12.5 % (ref 11.5–15.5)
WBC: 11.2 10*3/uL — ABNORMAL HIGH (ref 4.0–10.5)
nRBC: 0 % (ref 0.0–0.2)

## 2023-04-12 LAB — URINALYSIS, ROUTINE W REFLEX MICROSCOPIC
Bilirubin Urine: NEGATIVE
Glucose, UA: NEGATIVE mg/dL
Hgb urine dipstick: NEGATIVE
Ketones, ur: NEGATIVE mg/dL
Leukocytes,Ua: NEGATIVE
Nitrite: NEGATIVE
Protein, ur: NEGATIVE mg/dL
Specific Gravity, Urine: 1.024 (ref 1.005–1.030)
pH: 5 (ref 5.0–8.0)

## 2023-04-12 LAB — COMPREHENSIVE METABOLIC PANEL
ALT: 18 U/L (ref 0–44)
AST: 17 U/L (ref 15–41)
Albumin: 3.9 g/dL (ref 3.5–5.0)
Alkaline Phosphatase: 48 U/L (ref 38–126)
Anion gap: 8 (ref 5–15)
BUN: 11 mg/dL (ref 6–20)
CO2: 22 mmol/L (ref 22–32)
Calcium: 8.9 mg/dL (ref 8.9–10.3)
Chloride: 107 mmol/L (ref 98–111)
Creatinine, Ser: 0.72 mg/dL (ref 0.44–1.00)
GFR, Estimated: >60 mL/min (ref >60)
Glucose, Bld: 98 mg/dL (ref 70–99)
Potassium: 3.6 mmol/L (ref 3.5–5.1)
Sodium: 137 mmol/L (ref 135–145)
Total Bilirubin: 0.6 mg/dL (ref 0.3–1.2)
Total Protein: 6.9 g/dL (ref 6.5–8.1)

## 2023-04-12 LAB — POC URINE PREG, ED: Preg Test, Ur: NEGATIVE

## 2023-04-12 MED ORDER — KETOROLAC TROMETHAMINE 30 MG/ML IJ SOLN
30.0000 mg | Freq: Once | INTRAMUSCULAR | Status: AC
  Administered 2023-04-12: 30 mg via INTRAVENOUS
  Filled 2023-04-12: qty 1

## 2023-04-12 MED ORDER — ONDANSETRON 4 MG PO TBDP: 4.0000 mg | ORAL_TABLET | Freq: Four times a day (QID) | ORAL | 0 refills | Status: DC | PRN

## 2023-04-12 MED ORDER — HYDROMORPHONE HCL 1 MG/ML IJ SOLN
1.0000 mg | Freq: Once | INTRAMUSCULAR | Status: AC
Start: 2023-04-12 — End: 2023-04-12
  Administered 2023-04-12: 1 mg via INTRAVENOUS
  Filled 2023-04-12: qty 1

## 2023-04-12 MED ORDER — MORPHINE SULFATE (PF) 4 MG/ML IV SOLN
4.0000 mg | Freq: Once | INTRAVENOUS | Status: AC
  Administered 2023-04-12: 4 mg via INTRAVENOUS
  Filled 2023-04-12: qty 1

## 2023-04-12 MED ORDER — OXYCODONE-ACETAMINOPHEN 5-325 MG PO TABS: 2.0000 | ORAL_TABLET | Freq: Four times a day (QID) | ORAL | 0 refills | Status: DC | PRN

## 2023-04-12 MED ORDER — IBUPROFEN 800 MG PO TABS: 800.0000 mg | ORAL_TABLET | Freq: Three times a day (TID) | ORAL | 0 refills | Status: DC | PRN

## 2023-04-12 MED ORDER — ONDANSETRON HCL 4 MG/2ML IJ SOLN
4.0000 mg | Freq: Once | INTRAMUSCULAR | Status: AC
  Administered 2023-04-12: 4 mg via INTRAVENOUS
  Filled 2023-04-12: qty 2

## 2023-04-12 MED ORDER — SODIUM CHLORIDE 0.9 % IV BOLUS (SEPSIS)
1000.0000 mL | Freq: Once | INTRAVENOUS | Status: AC
Start: 2023-04-12 — End: 2023-04-12
  Administered 2023-04-12: 1000 mL via INTRAVENOUS

## 2023-04-12 NOTE — Discharge Instructions (Addendum)

## 2023-04-12 NOTE — ED Notes (Signed)
POC pregnancy test result: negative

## 2023-04-12 NOTE — ED Triage Notes (Signed)
Pt to triage via w/c, appears uncomfortable, grimacing; st sudden onset left flank pain tonight, nonradiating with no accomp symptoms; st hx kidney stones

## 2023-04-12 NOTE — ED Provider Notes (Signed)
[facility] Provider Note    Event Date/Time   First MD Initiated Contact with Patient 04/12/23 0013     (approximate)   History   Flank Pain   HPI  Martha Oconnor is a 32 y.o. female with history of previous kidney stone who presents to the emergency department with sudden onset left flank pain, nausea that feels similar to her previous kidney stone.  Started while she was trying to go to sleep.  No vomiting, fevers, dysuria.   No recent injury to the back.  No numbness or weakness.  Pain is not worse with palpation or movement.  History provided by patient, husband.    Past Medical History:  Diagnosis Date   ADHD    Anxiety    Clotting disorder (HCC)    Cyst of skin 05/27/2021   Depression    Headache    migraine   Heart murmur    PTSD (post-traumatic stress disorder)     Past Surgical History:  Procedure Laterality Date   LUMBAR LAMINECTOMY/DECOMPRESSION MICRODISCECTOMY Left 11/15/2021   Procedure: LEFT L5-S1 MICRODISCECTOMY;  Surgeon: Venetia Night, MD;  Location: [facility] ORS;  Service: Neurosurgery;  Laterality: Left;   TUBAL LIGATION      MEDICATIONS:  Prior to Admission medications   Medication Sig Start Date End Date Taking? Authorizing Provider  cyclobenzaprine (FLEXERIL) 10 MG tablet Take 1 tablet (10 mg total) by mouth 3 (three) times daily as needed. 12/09/22   Kem Boroughs B, FNP  Levonorgestrel-Ethinyl Estradiol (SIMPESSE) 0.15-0.03 &0.01 MG tablet TAKE 1 TABLET BY MOUTH EVERY NIGHT AT BEDTIME 02/13/23   Linzie Collin, MD  lisdexamfetamine (VYVANSE) 50 MG capsule Take 50 mg by mouth daily. 08/09/22   [provider]  ondansetron (ZOFRAN) 4 MG tablet Take 1 tablet (4 mg total) by mouth every 8 (eight) hours as needed for nausea or vomiting. 06/08/22   Mecum, Erin E, PA-C  sertraline (ZOLOFT) 50 MG tablet Take 50 mg by mouth daily. 07/06/22   [provider]    Physical Exam   Triage Vital Signs: ED  Triage Vitals [04/12/23 0014]  Encounter Vitals Group     BP      Systolic BP Percentile      Diastolic BP Percentile      Pulse      Resp      Temp      Temp src      SpO2      Weight 175 lb (79.4 kg)     Height 5\' 4"  (1.626 m)     Head Circumference      Peak Flow      Pain Score 8     Pain Loc      Pain Education      Exclude from Growth Chart     Most recent vital signs: Vitals:   04/12/23 0018  BP: (!) 130/92  Pulse: 90  Resp: 20  Temp: 98.6 F (37 C)  SpO2: 100%    CONSTITUTIONAL: Alert, responds appropriately to questions.  Appears uncomfortable, afebrile, nontoxic HEAD: Normocephalic, atraumatic EYES: Conjunctivae clear, pupils appear equal, sclera nonicteric ENT: normal nose; moist mucous membranes NECK: Supple, normal ROM CARD: RRR; S1 and S2 appreciated RESP: Normal chest excursion without splinting or tachypnea; breath sounds clear and equal bilaterally; no wheezes, no rhonchi, no rales, no hypoxia or respiratory distress, speaking full sentences ABD/GI: Non-distended; soft, non-tender, no rebound, no guarding, no peritoneal signs BACK: The back appears  normal, no midline spinal tenderness or step-off or deformity, pain is not reproducible with palpation EXT: Normal ROM in all joints; no deformity noted, no edema SKIN: Normal color for age and race; warm; no rash on exposed skin NEURO: Moves all extremities equally, normal speech, normal sensation, normal gait PSYCH: The patient's mood and manner are appropriate.   ED Results / Procedures / Treatments   LABS: (all labs ordered are listed, but only abnormal results are displayed) Labs Reviewed  CBC WITH DIFFERENTIAL/PLATELET - Abnormal; Notable for the following components:      Result Value   WBC 11.2 (*)    All other components within normal limits  URINALYSIS, ROUTINE W REFLEX MICROSCOPIC - Abnormal; Notable for the following components:   Color, Urine YELLOW (*)    APPearance HAZY (*)    All  other components within normal limits  COMPREHENSIVE METABOLIC PANEL  POC URINE PREG, ED     EKG:   RADIOLOGY: My personal review and interpretation of imaging: CT scan shows a stone in the right kidney but no ureteral calculus.  No pyelonephritis.  I have personally reviewed all radiology reports.   CT Renal Stone Study  Result Date: 04/12/2023 CLINICAL DATA:  Left flank pain EXAM: CT ABDOMEN AND PELVIS WITHOUT CONTRAST TECHNIQUE: Multidetector CT imaging of the abdomen and pelvis was performed following the standard protocol without IV contrast. RADIATION DOSE REDUCTION: This exam was performed according to the departmental dose-optimization program which includes automated exposure control, adjustment of the mA and/or kV according to patient size and/or use of iterative reconstruction technique. COMPARISON:  02/04/2021 FINDINGS: Lower chest: Lung bases are clear. Hepatobiliary: Unenhanced liver is unremarkable. Gallbladder is unremarkable. No intrahepatic or extrahepatic ductal dilatation. Pancreas: Within normal limits. Spleen: Within normal limits. Adrenals/Urinary Tract: Adrenal glands are within normal limits. Kidneys are within normal limits. 3 mm nonobstructing right lower pole renal calculus (series 2/image 46). No ureteral bladder calculi. No hydronephrosis. Bladder is within normal limits. Stomach/Bowel: Stomach is within normal limits. No evidence of bowel obstruction. Normal appendix (series 2/image 9). No colonic wall thickening or inflammatory changes. Vascular/Lymphatic: No evidence of abdominal aortic aneurysm. No suspicious abdominopelvic lymphadenopathy. Reproductive: Uterus is normal limits. Bilateral ovaries are within normal limits. Other: No abdominopelvic ascites. Musculoskeletal: Visualized osseous structures are within normal limits. IMPRESSION: 3 mm nonobstructing right lower pole renal calculus. No ureteral bladder calculi. No hydronephrosis. No CT findings to account for  the patient's left flank pain. Electronically Signed   By: Charline Bills M.D.   On: 04/12/2023 01:28     PROCEDURES:  Critical Care performed: No     Procedures    IMPRESSION / MDM / ASSESSMENT AND PLAN / ED COURSE  I reviewed the triage vital signs and the nursing notes.    Patient here with sudden onset flank pain.  History of kidney stones.     DIFFERENTIAL DIAGNOSIS (includes but not limited to):   Kidney stone, pyelonephritis, ascending UTI, musculoskeletal back pain   Patient's presentation is most consistent with acute presentation with potential threat to life or bodily function.   PLAN: Will obtain labs, urine, CT renal study.  Will give IV fluids, pain and nausea medicine.   MEDICATIONS GIVEN IN ED: Medications  sodium chloride 0.9 % bolus 1,000 mL (0 mLs Intravenous Stopped 04/12/23 0111)  morphine (PF) 4 MG/ML injection 4 mg (4 mg Intravenous Given 04/12/23 0039)  ondansetron (ZOFRAN) injection 4 mg (4 mg Intravenous Given 04/12/23 0041)  ketorolac (TORADOL) 30  MG/ML injection 30 mg (30 mg Intravenous Given 04/12/23 0041)  HYDROmorphone (DILAUDID) injection 1 mg (1 mg Intravenous Given 04/12/23 0144)     ED COURSE: Pregnancy test is negative.  Confirmed with nurse.  Labs show slight leukocytosis.  Normal creatinine, LFTs.  Urine does not appear infected.  CT renal study reviewed and interpreted by myself and the radiologist and shows 3 mm stone in the right lower pole but no ureterolithiasis, pyelonephritis.  I still suspect clinically she has had a kidney stone that she may have passed especially given sudden onset and pain.  Pain not reproducible with palpation.  Pain has improved.  Will discharge with short course of pain medication and have her follow-up with urology as needed.  At this time, I do not feel there is any life-threatening condition present. I reviewed all nursing notes, vitals, pertinent previous records.  All lab and urine results,  EKGs, imaging ordered have been independently reviewed and interpreted by myself.  I reviewed all available radiology reports from any imaging ordered this visit.  Based on my assessment, I feel the patient is safe to be discharged home without further emergent workup and can continue workup as an outpatient as needed. Discussed all findings, treatment plan as well as usual and customary return precautions.  They verbalize understanding and are comfortable with this plan.  Outpatient follow-up has been provided as needed.  All questions have been answered.   CONSULTS:  none   OUTSIDE RECORDS REVIEWED: Reviewed previous neurosurgery notes in 2023 for lumbar radiculopathy.       FINAL CLINICAL IMPRESSION(S) / ED DIAGNOSES   Final diagnoses:  Left flank pain     Rx / DC Orders   ED Discharge Orders          Ordered    oxyCODONE-acetaminophen (PERCOCET/ROXICET) 5-325 MG tablet  Every 6 hours PRN        04/12/23 0149    ibuprofen (ADVIL) 800 MG tablet  Every 8 hours PRN        04/12/23 0149    ondansetron (ZOFRAN-ODT) 4 MG disintegrating tablet  Every 6 hours PRN        04/12/23 0149             Note:  This document was prepared using Dragon voice recognition software and may include unintentional dictation errors.   , Layla Maw, DO 04/12/23 [id number]

## 2023-04-14 ENCOUNTER — Telehealth: Payer: Self-pay

## 2023-04-14 NOTE — Transitions of Care (Post Inpatient/ED Visit) (Signed)
   04/14/2023  Name: Martha Oconnor MRN: [id number] DOB: January 29, 1991  Today's TOC FU Call Status: Today's TOC FU Call Status:: Successful TOC FU Call Completed TOC FU Call Complete Date: 04/14/23  Transition Care Management Follow-up Telephone Call Date of Discharge: 04/12/23 Discharge Facility: [facility]) Type of Discharge: Emergency Department Reason for ED Visit: Renal How have you been since you were released from the hospital?: Better Any questions or concerns?: No  Items Reviewed: Did you receive and understand the discharge instructions provided?: No Medications obtained,verified, and reconciled?: Yes (Medications Reviewed) Any new allergies since your discharge?: No Dietary orders reviewed?: No Do you have support at home?: No  Medications Reviewed Today: Medications Reviewed Today     Reviewed by Pablo Ledger, CMA (Certified Medical Assistant) on 04/14/23 at 1000  Med List Status: <None>   Medication Order Taking? Sig Documenting Provider Last Dose Status Informant  cyclobenzaprine (FLEXERIL) 10 MG tablet [id number] Yes Take 1 tablet (10 mg total) by mouth 3 (three) times daily as needed. Kem Boroughs B, FNP Taking Active   ibuprofen (ADVIL) 800 MG tablet [id number] Yes Take 1 tablet (800 mg total) by mouth every 8 (eight) hours as needed. Ward, Layla Maw, DO Taking Active   Levonorgestrel-Ethinyl Estradiol (SIMPESSE) 0.15-0.03 &0.01 MG tablet [id number] Yes TAKE 1 TABLET BY MOUTH EVERY NIGHT AT BEDTIME Linzie Collin, MD Taking Active   lisdexamfetamine (VYVANSE) 50 MG capsule [id number] Yes Take 50 mg by mouth daily. [provider] Taking Active   ondansetron (ZOFRAN) 4 MG tablet [id number] Yes Take 1 tablet (4 mg total) by mouth every 8 (eight) hours as needed for nausea or vomiting. Mecum, Oswaldo Conroy, PA-C Taking Active   ondansetron (ZOFRAN-ODT) 4 MG disintegrating tablet [id number] Yes Take 1 tablet (4 mg total) by mouth every  6 (six) hours as needed for nausea or vomiting. Ward, Layla Maw, DO Taking Active   oxyCODONE-acetaminophen (PERCOCET/ROXICET) 5-325 MG tablet [id number] Yes Take 2 tablets by mouth every 6 (six) hours as needed. Ward, Layla Maw, DO Taking Active   sertraline (ZOLOFT) 50 MG tablet [id number] Yes Take 50 mg by mouth daily. [provider] Taking Active             Home Care and Equipment/Supplies: Were Home Health Services Ordered?: No Any new equipment or medical supplies ordered?: No  Functional Questionnaire: Do you need assistance with bathing/showering or dressing?: No Do you need assistance with meal preparation?: No Do you need assistance with eating?: No Do you have difficulty maintaining continence: No Do you need assistance with getting out of bed/getting out of a chair/moving?: No Do you have difficulty managing or taking your medications?: No  Follow up appointments reviewed: PCP Follow-up appointment confirmed?: No MD Provider Line [phone] Given: No Specialist Hospital Follow-up appointment confirmed?: No Do you need transportation to your follow-up appointment?: No Do you understand care options if your condition(s) worsen?: Yes-patient verbalized understanding    SIGNATURE: Wilhemena Durie, CMA

## 2023-07-12 ENCOUNTER — Emergency Department: Payer: MEDICAID

## 2023-07-12 ENCOUNTER — Emergency Department
Admission: EM | Admit: 2023-07-12 | Discharge: 2023-07-12 | Disposition: A | Discharge status: Home / Self Care | Payer: MEDICAID | Attending: Emergency Medicine | Admitting: Emergency Medicine

## 2023-07-12 ENCOUNTER — Other Ambulatory Visit: Payer: Self-pay

## 2023-07-12 ENCOUNTER — Encounter: Payer: Self-pay | Admitting: Emergency Medicine

## 2023-07-12 DIAGNOSIS — S161XXA Strain of muscle, fascia and tendon at neck level, initial encounter: Secondary | ICD-10-CM | POA: Diagnosis not present

## 2023-07-12 DIAGNOSIS — Y92410 Unspecified street and highway as the place of occurrence of the external cause: Secondary | ICD-10-CM | POA: Diagnosis not present

## 2023-07-12 DIAGNOSIS — M5416 Radiculopathy, lumbar region: Secondary | ICD-10-CM | POA: Insufficient documentation

## 2023-07-12 DIAGNOSIS — M542 Cervicalgia: Secondary | ICD-10-CM | POA: Diagnosis present

## 2023-07-12 DIAGNOSIS — S93601A Unspecified sprain of right foot, initial encounter: Secondary | ICD-10-CM | POA: Diagnosis not present

## 2023-07-12 LAB — POC URINE PREG, ED: Preg Test, Ur: NEGATIVE

## 2023-07-12 MED ORDER — HYDROCODONE-ACETAMINOPHEN 5-325 MG PO TABS: 1.0000 | ORAL_TABLET | Freq: Three times a day (TID) | ORAL | 0 refills | Status: AC | PRN

## 2023-07-12 MED ORDER — LIDOCAINE 5 % EX PTCH: 1.0000 | MEDICATED_PATCH | Freq: Two times a day (BID) | CUTANEOUS | 0 refills | Status: AC | PRN

## 2023-07-12 MED ORDER — CYCLOBENZAPRINE HCL 10 MG PO TABS
10.0000 mg | ORAL_TABLET | Freq: Once | ORAL | Status: AC
  Administered 2023-07-12: 10 mg via ORAL
  Filled 2023-07-12: qty 1

## 2023-07-12 MED ORDER — IBUPROFEN 800 MG PO TABS: 800.0000 mg | ORAL_TABLET | Freq: Three times a day (TID) | ORAL | 0 refills | Status: DC | PRN

## 2023-07-12 MED ORDER — LIDOCAINE 5 % EX PTCH
1.0000 | MEDICATED_PATCH | Freq: Once | CUTANEOUS | Status: DC
Start: 2023-07-12 — End: 2023-07-13
  Administered 2023-07-12: 1 via TRANSDERMAL
  Filled 2023-07-12: qty 1

## 2023-07-12 MED ORDER — OXYCODONE-ACETAMINOPHEN 5-325 MG PO TABS
1.0000 | ORAL_TABLET | Freq: Once | ORAL | Status: AC
  Administered 2023-07-12: 1 via ORAL
  Filled 2023-07-12: qty 1

## 2023-07-12 MED ORDER — CYCLOBENZAPRINE HCL 5 MG PO TABS: 5.0000 mg | ORAL_TABLET | Freq: Three times a day (TID) | ORAL | 0 refills | Status: DC | PRN
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
## 2023-07-19 ENCOUNTER — Encounter: Payer: Self-pay | Admitting: Orthopedic Surgery

## 2023-07-19 ENCOUNTER — Ambulatory Visit (INDEPENDENT_AMBULATORY_CARE_PROVIDER_SITE_OTHER): Payer: MEDICAID | Admitting: Orthopedic Surgery

## 2023-07-19 VITALS — BP 128/76 | Ht 64.0 in | Wt 174.0 lb

## 2023-07-19 DIAGNOSIS — Z9889 Other specified postprocedural states: Secondary | ICD-10-CM

## 2023-07-19 DIAGNOSIS — M5416 Radiculopathy, lumbar region: Secondary | ICD-10-CM | POA: Diagnosis not present

## 2023-07-19 DIAGNOSIS — M5126 Other intervertebral disc displacement, lumbar region: Secondary | ICD-10-CM

## 2023-07-19 DIAGNOSIS — M5127 Other intervertebral disc displacement, lumbosacral region: Secondary | ICD-10-CM

## 2023-07-19 MED ORDER — METHYLPREDNISOLONE 4 MG PO TBPK: ORAL_TABLET | ORAL | 0 refills | Status: DC

## 2023-07-19 NOTE — Patient Instructions (Signed)
It was so nice to see you today. Thank you so much for coming in.    CT from emergency room shows the recurrent disc herniation at L5-S1. I think this is what is causing your pain and weakness.   I want to get an MRI of your lower back to look into things further. We will get this approved through your insurance and [facility] will call you to schedule the appointment.   [facility] (building with the white pillars) is located off of [address]. The address is [address] [place], [place] [address].    After you have the MRI, please call and let me know so I can get it read. Once we have the results, we will schedule you to see Dr. Myer Haff.   I sent a prescription for a steroid dose pack to help with pain and inflammation. Take as directed. Stop ibuprofen when you are taking this.   Let me know if you need to come out of work.   Please do not hesitate to call if you have any questions or concerns. You can also message me in MyChart.   Drake Leach PA-C [phone]     The physicians and staff at [facility] at [facility] are committed to providing excellent care. You may receive a survey asking for feedback about your experience at our office. We value you your feedback and appreciate you taking the time to to fill it out. The [facility] leadership team is also available to discuss your experience in person, feel free to contact us [phone].

## 2023-07-21 ENCOUNTER — Ambulatory Visit
Admission: RE | Admit: 2023-07-21 | Discharge: 2023-07-21 | Disposition: A | Discharge status: Home / Self Care | Payer: MEDICAID | Source: Ambulatory Visit | Attending: Orthopedic Surgery | Admitting: Orthopedic Surgery

## 2023-07-21 DIAGNOSIS — Z9889 Other specified postprocedural states: Secondary | ICD-10-CM | POA: Insufficient documentation

## 2023-07-21 DIAGNOSIS — M5126 Other intervertebral disc displacement, lumbar region: Secondary | ICD-10-CM | POA: Diagnosis present

## 2023-07-21 DIAGNOSIS — M5117 Intervertebral disc disorders with radiculopathy, lumbosacral region: Secondary | ICD-10-CM | POA: Insufficient documentation

## 2023-07-21 DIAGNOSIS — M4804 Spinal stenosis, thoracic region: Secondary | ICD-10-CM | POA: Insufficient documentation

## 2023-07-21 DIAGNOSIS — M5416 Radiculopathy, lumbar region: Secondary | ICD-10-CM

## 2023-07-24 ENCOUNTER — Telehealth: Payer: Self-pay

## 2023-07-24 NOTE — Telephone Encounter (Signed)
I called radiology and requested the results. Please schedule a new patient appointment with Dr Myer Haff. Thanks!
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
## 2023-08-01 ENCOUNTER — Encounter: Payer: Self-pay | Admitting: Neurosurgery

## 2023-08-01 ENCOUNTER — Ambulatory Visit (INDEPENDENT_AMBULATORY_CARE_PROVIDER_SITE_OTHER): Payer: MEDICAID | Admitting: Neurosurgery

## 2023-08-01 ENCOUNTER — Telehealth: Payer: Self-pay

## 2023-08-01 ENCOUNTER — Other Ambulatory Visit: Payer: Self-pay

## 2023-08-01 VITALS — BP 132/82 | Ht 64.0 in | Wt 170.8 lb

## 2023-08-01 DIAGNOSIS — R29898 Other symptoms and signs involving the musculoskeletal system: Secondary | ICD-10-CM

## 2023-08-01 DIAGNOSIS — M5126 Other intervertebral disc displacement, lumbar region: Secondary | ICD-10-CM

## 2023-08-01 DIAGNOSIS — M5416 Radiculopathy, lumbar region: Secondary | ICD-10-CM

## 2023-08-01 DIAGNOSIS — M5127 Other intervertebral disc displacement, lumbosacral region: Secondary | ICD-10-CM

## 2023-08-01 DIAGNOSIS — Z01818 Encounter for other preprocedural examination: Secondary | ICD-10-CM

## 2023-08-01 NOTE — Telephone Encounter (Signed)
Called and scheduled appointment for patient on 08/04/2023 @ 8:20 am.

## 2023-08-01 NOTE — Telephone Encounter (Signed)
Recueved surgery clearance from Neurosurgery for the patient. Surgery is scheduled for 08/07/23. Please call and schedule surgery clearance appointment by Thursday if at all possible.

## 2023-08-04 ENCOUNTER — Encounter
Admission: RE | Admit: 2023-08-04 | Discharge: 2023-08-04 | Disposition: A | Discharge status: Home / Self Care | Payer: MEDICAID | Source: Ambulatory Visit | Attending: Neurosurgery | Admitting: Neurosurgery

## 2023-08-04 ENCOUNTER — Other Ambulatory Visit: Payer: Self-pay

## 2023-08-04 ENCOUNTER — Encounter: Payer: Self-pay | Admitting: Nurse Practitioner

## 2023-08-04 ENCOUNTER — Encounter
Admission: RE | Admit: 2023-08-04 | Discharge: 2023-08-04 | Disposition: A | Discharge status: Home / Self Care | Payer: No Typology Code available for payment source | Source: Ambulatory Visit | Attending: Neurosurgery | Admitting: Neurosurgery

## 2023-08-04 ENCOUNTER — Ambulatory Visit (INDEPENDENT_AMBULATORY_CARE_PROVIDER_SITE_OTHER): Payer: MEDICAID | Admitting: Nurse Practitioner

## 2023-08-04 VITALS — BP 136/80 | HR 82 | Temp 98.0°F | Ht 64.0 in | Wt 171.0 lb

## 2023-08-04 DIAGNOSIS — Z01812 Encounter for preprocedural laboratory examination: Secondary | ICD-10-CM | POA: Insufficient documentation

## 2023-08-04 DIAGNOSIS — Z01818 Encounter for other preprocedural examination: Secondary | ICD-10-CM

## 2023-08-04 DIAGNOSIS — M5416 Radiculopathy, lumbar region: Secondary | ICD-10-CM | POA: Diagnosis not present

## 2023-08-04 HISTORY — DX: Cannabis use, unspecified, uncomplicated: F12.90

## 2023-08-04 HISTORY — DX: Radiculopathy, lumbar region: M54.16

## 2023-08-04 HISTORY — DX: Other intervertebral disc displacement, lumbar region: M51.26

## 2023-08-04 HISTORY — DX: Personal history of urinary calculi: Z87.442

## 2023-08-04 HISTORY — DX: Migraine, unspecified, not intractable, without status migrainosus: G43.909

## 2023-08-04 LAB — BASIC METABOLIC PANEL
Anion gap: 7 (ref 5–15)
BUN: 13 mg/dL (ref 6–20)
CO2: 25 mmol/L (ref 22–32)
Calcium: 9.1 mg/dL (ref 8.9–10.3)
Chloride: 105 mmol/L (ref 98–111)
Creatinine, Ser: 0.57 mg/dL (ref 0.44–1.00)
GFR, Estimated: >60 mL/min (ref >60)
Glucose, Bld: 73 mg/dL (ref 70–99)
Potassium: 3.5 mmol/L (ref 3.5–5.1)
Sodium: 137 mmol/L (ref 135–145)

## 2023-08-04 LAB — URINALYSIS, COMPLETE (UACMP) WITH MICROSCOPIC
Bacteria, UA: NONE SEEN
Bilirubin Urine: NEGATIVE
Glucose, UA: NEGATIVE mg/dL
Ketones, ur: NEGATIVE mg/dL
Leukocytes,Ua: NEGATIVE
Nitrite: NEGATIVE
Protein, ur: NEGATIVE mg/dL
RBC / HPF: >50 RBC/hpf (ref 0–5)
Specific Gravity, Urine: 1.02 (ref 1.005–1.030)
pH: 5 (ref 5.0–8.0)

## 2023-08-04 LAB — CBC
HCT: 44.1 % (ref 36.0–46.0)
Hemoglobin: 14.9 g/dL (ref 12.0–15.0)
MCH: 31.3 pg (ref 26.0–34.0)
MCHC: 33.8 g/dL (ref 30.0–36.0)
MCV: 92.6 fL (ref 80.0–100.0)
Platelets: 290 10*3/uL (ref 150–400)
RBC: 4.76 MIL/uL (ref 3.87–5.11)
RDW: 12 % (ref 11.5–15.5)
WBC: 7.5 10*3/uL (ref 4.0–10.5)
nRBC: 0 % (ref 0.0–0.2)

## 2023-08-04 LAB — SURGICAL PCR SCREEN
MRSA, PCR: NEGATIVE
Staphylococcus aureus: NEGATIVE

## 2023-08-04 LAB — TYPE AND SCREEN
ABO/RH(D): O POS
Antibody Screen: NEGATIVE
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
## 2023-08-04 NOTE — Assessment & Plan Note (Signed)
Chronic and ongoing, having surgery on 08/07/23.  Will obtain all pre-op needs today.  At this point is cleared for surgery unless significantly abnormal labs return.

## 2023-08-04 NOTE — Patient Instructions (Addendum)
Your procedure is scheduled on: 08/07/23 - Monday Report to the Registration Desk on the 1st floor of the [facility]. To find out your arrival time, please call [phone] between 1PM - 3PM on: 08/04/23 - Friday If your arrival time is 6:00 am, do not arrive before that time as the [facility] entrance doors do not open until 6:00 am.  REMEMBER: Instructions that are not followed completely may result in serious medical risk, up to and including death; or upon the discretion of your surgeon and anesthesiologist your surgery may need to be rescheduled.  Do not eat food after midnight the night before surgery.  No gum chewing or hard candies.  You may however, drink CLEAR liquids up to 2 hours before you are scheduled to arrive for your surgery. Do not drink anything within 2 hours of your scheduled arrival time.  Clear liquids include: - water  - apple juice without pulp - gatorade (not RED colors) - black coffee or tea (Do NOT add milk or creamers to the coffee or tea) Do NOT drink anything that is not on this list.  You may continue taking  Anti-inflammatories (NSAIDS) such as Advil, Aleve, Ibuprofen, Motrin, Naproxen, Naprosyn and Aspirin based products such as Excedrin, Goody's Powder, BC Powder.  Stop ANY OVER THE COUNTER supplements until after surgery.  You may however, continue to take Tylenol if needed for pain up until the day of surgery.   ON THE DAY OF SURGERY ONLY TAKE THESE MEDICATIONS WITH SIPS OF WATER:  VYVANSE    No Alcohol for 24 hours before or after surgery.  No Smoking including e-cigarettes for 24 hours before surgery.  No chewable tobacco products for at least 6 hours before surgery.  No nicotine patches on the day of surgery.  Do not use any "recreational" drugs for at least a week (preferably 2 weeks) before your surgery.  Please be advised that the combination of cocaine and anesthesia may have negative outcomes, up to and including death. If  you test positive for cocaine, your surgery will be cancelled.  On the morning of surgery brush your teeth with toothpaste and water, you may rinse your mouth with mouthwash if you wish. Do not swallow any toothpaste or mouthwash.  Use CHG Soap or wipes as directed on instruction sheet.  Do not wear jewelry, make-up, hairpins, clips or nail polish.  For welded (permanent) jewelry: bracelets, anklets, waist bands, etc.  Please have this removed prior to surgery.  If it is not removed, there is a chance that hospital personnel will need to cut it off on the day of surgery.  Do not wear lotions, powders, or perfumes.   Contact lenses, hearing aids and dentures may not be worn into surgery.  Do not bring valuables to the hospital. [facility] is not responsible for any missing/lost belongings or valuables.   Notify your doctor if there is any change in your medical condition (cold, fever, infection).  Wear comfortable clothing (specific to your surgery type) to the hospital.  After surgery, you can help prevent lung complications by doing breathing exercises.  Take deep breaths and cough every 1-2 hours. Your doctor may order a device called an Incentive Spirometer to help you take deep breaths. When coughing or sneezing, hold a pillow firmly against your incision with both hands. This is called "splinting." Doing this helps protect your incision. It also decreases belly discomfort.  If you are being admitted to the hospital overnight, leave your  suitcase in the car. After surgery it may be brought to your room.  In case of increased patient census, it may be necessary for you, the patient, to continue your postoperative care in the Same Day Surgery department.  If you are being discharged the day of surgery, you will not be allowed to drive home. You will need a responsible individual to drive you home and stay with you for 24 hours after surgery.   If you are taking public  transportation, you will need to have a responsible individual with you.  Please call the Pre-admissions Testing Dept. at [phone] if you have any questions about these instructions.  Surgery Visitation Policy:  Patients having surgery or a procedure may have two visitors.  Children under the age of 44 must have an adult with them who is not the patient.  Inpatient Visitation:    Visiting hours are 7 a.m. to 8 p.m. Up to four visitors are allowed at one time in a patient room. The visitors may rotate out with other people during the day.  One visitor age 75 or older may stay with the patient overnight and must be in the room by 8 p.m.    Pre-operative 5 CHG Bath Instructions   You can play a key role in reducing the risk of infection after surgery. Your skin needs to be as free of germs as possible. You can reduce the number of germs on your skin by washing with CHG (chlorhexidine gluconate) soap before surgery. CHG is an antiseptic soap that kills germs and continues to kill germs even after washing.   DO NOT use if you have an allergy to chlorhexidine/CHG or antibacterial soaps. If your skin becomes reddened or irritated, stop using the CHG and notify one of our RNs at [phone].   Please shower with the CHG soap starting 4 days before surgery using the following schedule: 08/03/23 - 08/07/23.  Please keep in mind the following:  DO NOT shave, including legs and underarms, starting the day of your first shower.   You may shave your face at any point before/day of surgery.  Place clean sheets on your bed the day you start using CHG soap. Use a clean washcloth (not used since being washed) for each shower. DO NOT sleep with pets once you start using the CHG.   CHG Shower Instructions:  If you choose to wash your hair and private area, wash first with your normal shampoo/soap.  After you use shampoo/soap, rinse your hair and body thoroughly to remove shampoo/soap residue.   Turn the water OFF and apply about 3 tablespoons (45 ml) of CHG soap to a CLEAN washcloth.  Apply CHG soap ONLY FROM YOUR NECK DOWN TO YOUR TOES (washing for 3-5 minutes)  DO NOT use CHG soap on face, private areas, open wounds, or sores.  Pay special attention to the area where your surgery is being performed.  If you are having back surgery, having someone wash your back for you may be helpful. Wait 2 minutes after CHG soap is applied, then you may rinse off the CHG soap.  Pat dry with a clean towel  Put on clean clothes/pajamas   If you choose to wear lotion, please use ONLY the CHG-compatible lotions on the back of this paper.     Additional instructions for the day of surgery: DO NOT APPLY any lotions, deodorants, cologne, or perfumes.   Put on clean/comfortable clothes.  Brush your teeth.  Ask your  nurse before applying any prescription medications to the skin.      CHG Compatible Lotions   Aveeno Moisturizing lotion  Cetaphil Moisturizing Cream  Cetaphil Moisturizing Lotion  Clairol Herbal Essence Moisturizing Lotion, Dry Skin  Clairol Herbal Essence Moisturizing Lotion, Extra Dry Skin  Clairol Herbal Essence Moisturizing Lotion, Normal Skin  Curel Age Defying Therapeutic Moisturizing Lotion with Alpha Hydroxy  Curel Extreme Care Body Lotion  Curel Soothing Hands Moisturizing Hand Lotion  Curel Therapeutic Moisturizing Cream, Fragrance-Free  Curel Therapeutic Moisturizing Lotion, Fragrance-Free  Curel Therapeutic Moisturizing Lotion, Original Formula  Eucerin Daily Replenishing Lotion  Eucerin Dry Skin Therapy Plus Alpha Hydroxy Crme  Eucerin Dry Skin Therapy Plus Alpha Hydroxy Lotion  Eucerin Original Crme  Eucerin Original Lotion  Eucerin Plus Crme Eucerin Plus Lotion  Eucerin TriLipid Replenishing Lotion  Keri Anti-Bacterial Hand Lotion  Keri Deep Conditioning Original Lotion Dry Skin Formula Softly Scented  Keri Deep Conditioning Original Lotion, Fragrance  Free Sensitive Skin Formula  Keri Lotion Fast Absorbing Fragrance Free Sensitive Skin Formula  Keri Lotion Fast Absorbing Softly Scented Dry Skin Formula  Keri Original Lotion  Keri Skin Renewal Lotion Keri Silky Smooth Lotion  Keri Silky Smooth Sensitive Skin Lotion  Nivea Body Creamy Conditioning Oil  Nivea Body Extra Enriched Teacher, adult education Moisturizing Lotion Nivea Crme  Nivea Skin Firming Lotion  NutraDerm 30 Skin Lotion  NutraDerm Skin Lotion  NutraDerm Therapeutic Skin Cream  NutraDerm Therapeutic Skin Lotion  ProShield Protective Hand Cream  Provon moisturizing lotion

## 2023-08-04 NOTE — Patient Instructions (Signed)

## 2023-08-05 LAB — HEMOGLOBIN A1C
Est. average glucose Bld gHb Est-mCnc: 100 mg/dL
Hgb A1c MFr Bld: 5.1 % (ref 4.8–5.6)

## 2023-08-05 NOTE — Progress Notes (Signed)
Contacted via MyChart   Good evening Martha Oconnor, A1c shows no diabetes or prediabetes.:)

## 2023-08-06 MED ORDER — CEFAZOLIN IN SODIUM CHLORIDE 2-0.9 GM/100ML-% IV SOLN
2.0000 g | Freq: Once | INTRAVENOUS | Status: DC
  Filled 2023-08-06: qty 100

## 2023-08-06 MED ORDER — ORAL CARE MOUTH RINSE
15.0000 mL | Freq: Once | OROMUCOSAL | Status: AC
Start: 2023-08-06 — End: 2023-08-07

## 2023-08-06 MED ORDER — CEFAZOLIN SODIUM-DEXTROSE 2-4 GM/100ML-% IV SOLN
2.0000 g | INTRAVENOUS | Status: AC
  Administered 2023-08-07: 2 g via INTRAVENOUS

## 2023-08-06 MED ORDER — CHLORHEXIDINE GLUCONATE 0.12 % MT SOLN
15.0000 mL | Freq: Once | OROMUCOSAL | Status: AC
  Administered 2023-08-07: 15 mL via OROMUCOSAL

## 2023-08-06 MED ORDER — LACTATED RINGERS IV SOLN: INTRAVENOUS | Status: DC

## 2023-08-07 ENCOUNTER — Ambulatory Visit: Payer: MEDICAID

## 2023-08-07 ENCOUNTER — Encounter
Admission: RE | Disposition: A | Discharge status: Home / Self Care | Payer: Self-pay | Source: Home / Self Care | Attending: Neurosurgery

## 2023-08-07 ENCOUNTER — Ambulatory Visit: Payer: MEDICAID | Admitting: Urgent Care

## 2023-08-07 ENCOUNTER — Encounter: Payer: Self-pay | Admitting: Neurosurgery

## 2023-08-07 ENCOUNTER — Other Ambulatory Visit: Payer: Self-pay

## 2023-08-07 ENCOUNTER — Ambulatory Visit: Payer: MEDICAID | Admitting: Anesthesiology

## 2023-08-07 ENCOUNTER — Ambulatory Visit
Admission: RE | Admit: 2023-08-07 | Discharge: 2023-08-07 | Disposition: A | Discharge status: Home / Self Care | Payer: MEDICAID | Attending: Neurosurgery | Admitting: Neurosurgery

## 2023-08-07 DIAGNOSIS — M5116 Intervertebral disc disorders with radiculopathy, lumbar region: Secondary | ICD-10-CM | POA: Insufficient documentation

## 2023-08-07 DIAGNOSIS — R29898 Other symptoms and signs involving the musculoskeletal system: Secondary | ICD-10-CM

## 2023-08-07 DIAGNOSIS — M5416 Radiculopathy, lumbar region: Secondary | ICD-10-CM | POA: Diagnosis present

## 2023-08-07 DIAGNOSIS — F4310 Post-traumatic stress disorder, unspecified: Secondary | ICD-10-CM | POA: Insufficient documentation

## 2023-08-07 DIAGNOSIS — M5126 Other intervertebral disc displacement, lumbar region: Secondary | ICD-10-CM

## 2023-08-07 DIAGNOSIS — F32A Depression, unspecified: Secondary | ICD-10-CM | POA: Insufficient documentation

## 2023-08-07 DIAGNOSIS — Z01818 Encounter for other preprocedural examination: Secondary | ICD-10-CM

## 2023-08-07 DIAGNOSIS — Z01812 Encounter for preprocedural laboratory examination: Secondary | ICD-10-CM

## 2023-08-07 DIAGNOSIS — Z87891 Personal history of nicotine dependence: Secondary | ICD-10-CM | POA: Diagnosis not present

## 2023-08-07 HISTORY — PX: LUMBAR LAMINECTOMY/DECOMPRESSION MICRODISCECTOMY: SHX5026

## 2023-08-07 LAB — POCT PREGNANCY, URINE: Preg Test, Ur: NEGATIVE

## 2023-08-07 SURGERY — LUMBAR LAMINECTOMY/DECOMPRESSION MICRODISCECTOMY 1 LEVEL
Anesthesia: General | Site: Spine Lumbar | Laterality: Left

## 2023-08-07 MED ORDER — KETAMINE HCL 50 MG/5ML IJ SOSY
PREFILLED_SYRINGE | INTRAMUSCULAR | Status: AC
  Filled 2023-08-07: qty 5

## 2023-08-07 MED ORDER — DEXAMETHASONE SODIUM PHOSPHATE 10 MG/ML IJ SOLN
INTRAMUSCULAR | Status: DC | PRN
  Administered 2023-08-07: 10 mg via INTRAVENOUS

## 2023-08-07 MED ORDER — METHOCARBAMOL 500 MG PO TABS: 500.0000 mg | ORAL_TABLET | Freq: Four times a day (QID) | ORAL | 0 refills | Status: DC

## 2023-08-07 MED ORDER — OXYCODONE HCL 5 MG/5ML PO SOLN: 5.0000 mg | Freq: Once | ORAL | Status: AC | PRN

## 2023-08-07 MED ORDER — ACETAMINOPHEN 10 MG/ML IV SOLN
INTRAVENOUS | Status: AC
  Filled 2023-08-07: qty 100

## 2023-08-07 MED ORDER — PROPOFOL 10 MG/ML IV BOLUS
INTRAVENOUS | Status: AC
  Filled 2023-08-07: qty 40

## 2023-08-07 MED ORDER — SEVOFLURANE IN SOLN
RESPIRATORY_TRACT | Status: AC
  Filled 2023-08-07: qty 250

## 2023-08-07 MED ORDER — BUPIVACAINE-EPINEPHRINE (PF) 0.5% -1:200000 IJ SOLN
INTRAMUSCULAR | Status: DC | PRN
  Administered 2023-08-07: 3 mL via PERINEURAL

## 2023-08-07 MED ORDER — PHENYLEPHRINE 80 MCG/ML (10ML) SYRINGE FOR IV PUSH (FOR BLOOD PRESSURE SUPPORT)
PREFILLED_SYRINGE | INTRAVENOUS | Status: DC | PRN
  Administered 2023-08-07 (×3): 120 ug via INTRAVENOUS
  Administered 2023-08-07: 80 ug via INTRAVENOUS

## 2023-08-07 MED ORDER — SUCCINYLCHOLINE CHLORIDE 200 MG/10ML IV SOSY
PREFILLED_SYRINGE | INTRAVENOUS | Status: DC | PRN
  Administered 2023-08-07: 100 mg via INTRAVENOUS

## 2023-08-07 MED ORDER — KETAMINE HCL 50 MG/5ML IJ SOSY
PREFILLED_SYRINGE | INTRAMUSCULAR | Status: DC | PRN
  Administered 2023-08-07: 40 mg via INTRAVENOUS

## 2023-08-07 MED ORDER — SODIUM CHLORIDE (PF) 0.9 % IJ SOLN
INTRAMUSCULAR | Status: DC | PRN
  Administered 2023-08-07: 60 mL

## 2023-08-07 MED ORDER — MIDAZOLAM HCL 2 MG/2ML IJ SOLN
INTRAMUSCULAR | Status: AC
  Filled 2023-08-07: qty 2

## 2023-08-07 MED ORDER — BUPIVACAINE HCL (PF) 0.5 % IJ SOLN
INTRAMUSCULAR | Status: AC
  Filled 2023-08-07: qty 30

## 2023-08-07 MED ORDER — BUPIVACAINE-EPINEPHRINE (PF) 0.5% -1:200000 IJ SOLN
INTRAMUSCULAR | Status: AC
  Filled 2023-08-07: qty 10

## 2023-08-07 MED ORDER — SENNA 8.6 MG PO TABS: 1.0000 | ORAL_TABLET | Freq: Two times a day (BID) | ORAL | 0 refills | Status: DC | PRN

## 2023-08-07 MED ORDER — KETOROLAC TROMETHAMINE 30 MG/ML IJ SOLN
INTRAMUSCULAR | Status: DC | PRN
  Administered 2023-08-07: 30 mg via INTRAVENOUS

## 2023-08-07 MED ORDER — HYDROMORPHONE HCL 1 MG/ML IJ SOLN
INTRAMUSCULAR | Status: DC | PRN
  Administered 2023-08-07: 1 mg via INTRAVENOUS

## 2023-08-07 MED ORDER — FENTANYL CITRATE (PF) 100 MCG/2ML IJ SOLN
25.0000 ug | INTRAMUSCULAR | Status: DC | PRN
  Administered 2023-08-07: 25 ug via INTRAVENOUS

## 2023-08-07 MED ORDER — SODIUM CHLORIDE FLUSH 0.9 % IV SOLN
INTRAVENOUS | Status: AC
  Filled 2023-08-07: qty 20

## 2023-08-07 MED ORDER — OXYCODONE HCL 5 MG PO TABS
5.0000 mg | ORAL_TABLET | Freq: Once | ORAL | Status: AC | PRN
  Administered 2023-08-07: 5 mg via ORAL

## 2023-08-07 MED ORDER — HYDROMORPHONE HCL 1 MG/ML IJ SOLN
INTRAMUSCULAR | Status: AC
  Filled 2023-08-07: qty 1

## 2023-08-07 MED ORDER — OXYCODONE HCL 5 MG PO TABS: 5.0000 mg | ORAL_TABLET | ORAL | 0 refills | Status: DC | PRN

## 2023-08-07 MED ORDER — CEFAZOLIN SODIUM-DEXTROSE 2-4 GM/100ML-% IV SOLN
INTRAVENOUS | Status: AC
  Filled 2023-08-07: qty 100

## 2023-08-07 MED ORDER — OXYCODONE HCL 5 MG PO TABS
ORAL_TABLET | ORAL | Status: AC
  Filled 2023-08-07: qty 1

## 2023-08-07 MED ORDER — PROPOFOL 10 MG/ML IV BOLUS
INTRAVENOUS | Status: DC | PRN
  Administered 2023-08-07: 150 mg via INTRAVENOUS

## 2023-08-07 MED ORDER — SURGIFLO WITH THROMBIN (HEMOSTATIC MATRIX KIT) OPTIME
TOPICAL | Status: DC | PRN
  Administered 2023-08-07: 1 via TOPICAL

## 2023-08-07 MED ORDER — 0.9 % SODIUM CHLORIDE (POUR BTL) OPTIME
TOPICAL | Status: DC | PRN
  Administered 2023-08-07: 500 mL

## 2023-08-07 MED ORDER — METHYLPREDNISOLONE ACETATE 40 MG/ML IJ SUSP
INTRAMUSCULAR | Status: DC | PRN
  Administered 2023-08-07: 40 mg

## 2023-08-07 MED ORDER — ONDANSETRON HCL 4 MG/2ML IJ SOLN
INTRAMUSCULAR | Status: DC | PRN
  Administered 2023-08-07: 4 mg via INTRAVENOUS

## 2023-08-07 MED ORDER — METHYLPREDNISOLONE ACETATE 40 MG/ML IJ SUSP
INTRAMUSCULAR | Status: AC
  Filled 2023-08-07: qty 1

## 2023-08-07 MED ORDER — FENTANYL CITRATE (PF) 100 MCG/2ML IJ SOLN
INTRAMUSCULAR | Status: AC
  Filled 2023-08-07: qty 2

## 2023-08-07 MED ORDER — BUPIVACAINE LIPOSOME 1.3 % IJ SUSP
INTRAMUSCULAR | Status: AC
  Filled 2023-08-07: qty 20

## 2023-08-07 MED ORDER — LIDOCAINE HCL (CARDIAC) PF 100 MG/5ML IV SOSY
PREFILLED_SYRINGE | INTRAVENOUS | Status: DC | PRN
  Administered 2023-08-07: 100 mg via INTRAVENOUS

## 2023-08-07 MED ORDER — MIDAZOLAM HCL 2 MG/2ML IJ SOLN
INTRAMUSCULAR | Status: DC | PRN
  Administered 2023-08-07: 2 mg via INTRAVENOUS

## 2023-08-07 MED ORDER — ACETAMINOPHEN 10 MG/ML IV SOLN
INTRAVENOUS | Status: DC | PRN
  Administered 2023-08-07: 1000 mg via INTRAVENOUS

## 2023-08-07 MED ORDER — DEXMEDETOMIDINE HCL IN NACL 80 MCG/20ML IV SOLN
INTRAVENOUS | Status: DC | PRN
  Administered 2023-08-07: 8 ug via INTRAVENOUS
  Administered 2023-08-07: 12 ug via INTRAVENOUS
  Administered 2023-08-07 (×2): 8 ug via INTRAVENOUS

## 2023-08-07 MED ORDER — CHLORHEXIDINE GLUCONATE 0.12 % MT SOLN
OROMUCOSAL | Status: AC
  Filled 2023-08-07: qty 15

## 2023-08-07 SURGICAL SUPPLY — 33 items
BASIN KIT SINGLE STR (MISCELLANEOUS) ×1 IMPLANT
BUR NEURO DRILL SOFT 3.0X3.8M (BURR) ×1 IMPLANT
CNTNR URN SCR LID CUP LEK RST (MISCELLANEOUS) IMPLANT
DERMABOND ADVANCED .7 DNX12 (GAUZE/BANDAGES/DRESSINGS) ×1 IMPLANT
DRAPE C ARM PK CFD 31 SPINE (DRAPES) ×1 IMPLANT
DRAPE INCISE IOBAN 66X45 STRL (DRAPES) IMPLANT
DRAPE LAPAROTOMY 100X77 ABD (DRAPES) ×1 IMPLANT
DRAPE MICROSCOPE SPINE 48X150 (DRAPES) IMPLANT
DRSG OPSITE POSTOP 3X4 (GAUZE/BANDAGES/DRESSINGS) IMPLANT
ELECT EZSTD 165MM 6.5IN (MISCELLANEOUS) ×1
ELECT REM PT RETURN 9FT ADLT (ELECTROSURGICAL) ×1
ELECTRODE EZSTD 165MM 6.5IN (MISCELLANEOUS) ×1 IMPLANT
ELECTRODE REM PT RTRN 9FT ADLT (ELECTROSURGICAL) ×1 IMPLANT
GLOVE BIOGEL PI IND STRL 6.5 (GLOVE) ×1 IMPLANT
GLOVE SURG SYN 6.5 ES PF (GLOVE) ×1 IMPLANT
GLOVE SURG SYN 6.5 PF PI (GLOVE) ×1 IMPLANT
GLOVE SURG SYN 8.5 E (GLOVE) ×3 IMPLANT
GLOVE SURG SYN 8.5 PF PI (GLOVE) ×3 IMPLANT
GOWN SRG LRG LVL 4 IMPRV REINF (GOWNS) ×1 IMPLANT
GOWN SRG XL LVL 3 NONREINFORCE (GOWNS) ×1 IMPLANT
KIT SPINAL PRONEVIEW (KITS) ×1 IMPLANT
MANIFOLD NEPTUNE II (INSTRUMENTS) ×1 IMPLANT
MARKER SKIN DUAL TIP RULER LAB (MISCELLANEOUS) ×1 IMPLANT
NDL SAFETY ECLIPSE 18X1.5 (NEEDLE) ×1 IMPLANT
NS IRRIG 500ML POUR BTL (IV SOLUTION) ×1 IMPLANT
PACK LAMINECTOMY ARMC (PACKS) ×1 IMPLANT
SURGIFLO W/THROMBIN 8M KIT (HEMOSTASIS) ×1 IMPLANT
SUT STRATA 3-0 15 PS-2 (SUTURE) ×1 IMPLANT
SUT VIC AB 0 CT1 27XCR 8 STRN (SUTURE) ×1 IMPLANT
SUT VIC AB 2-0 CT1 18 (SUTURE) ×1 IMPLANT
SYR 3ML LL SCALE MARK (SYRINGE) ×2 IMPLANT
TRAP FLUID SMOKE EVACUATOR (MISCELLANEOUS) ×1 IMPLANT
WATER STERILE IRR 500ML POUR (IV SOLUTION) ×1 IMPLANT

## 2023-08-07 NOTE — Transfer of Care (Signed)
Immediate Anesthesia Transfer of Care Note  Patient: Martha Oconnor  Procedure(s) Performed: LEFT L5-S1 DISCECTOMY (Left: Spine Lumbar)  Patient Location: PACU  Anesthesia Type:General  Level of Consciousness: drowsy  Airway & Oxygen Therapy: Patient Spontanous Breathing and Patient connected to nasal cannula oxygen  Post-op Assessment: Report given to RN, Post -op Vital signs reviewed and stable, and Patient moving all extremities  Post vital signs: Reviewed and stable  Last Vitals:  Vitals Value Taken Time  BP 100/67 08/07/23 0845  Temp    Pulse 69 08/07/23 0851  Resp 22 08/07/23 0851  SpO2 100 % 08/07/23 0851  Vitals shown include unfiled device data.  Last Pain:  Vitals:   08/07/23 0637  TempSrc: Temporal  PainSc: 4          Complications: No notable events documented.

## 2023-08-07 NOTE — Op Note (Signed)
Indications: Martha Oconnor is suffering from lumbar radiculopathy. The patient tried and failed conservative management, prompting surgical intervention.  She previously had a discectomy in 2023.  Findings: recurrent disc herniation  Preoperative Diagnosis: M54.16 Lumbar radiculopathy, M51.26 Recurrent herniation of lumbar disc, R29.898 Left leg weakness  Postoperative Diagnosis: same   EBL: 10 ml IVF: see anesthesia record Drains: none Disposition: Extubated and Stable to PACU Complications: none  No foley catheter was placed.   Preoperative Note:   Risks of surgery discussed include: infection, bleeding, stroke, coma, death, paralysis, CSF leak, nerve/spinal cord injury, numbness, tingling, weakness, complex regional pain syndrome, recurrent stenosis and/or disc herniation, vascular injury, development of instability, neck/back pain, need for further surgery, persistent symptoms, development of deformity, and the risks of anesthesia. The patient understood these risks and agreed to proceed.  Operative Note:   1) Left L5/S1 microdiscectomy (re-do)  The patient was then brought from the preoperative center with intravenous access established.  The patient underwent general anesthesia and endotracheal tube intubation, and was then rotated on the Buckley rail top where all pressure points were appropriately padded.  The skin was then thoroughly cleansed.  Perioperative antibiotic prophylaxis was administered.  Sterile prep and drapes were then applied and a timeout was then observed.  C-arm was brought into the field under sterile conditions, and the L5-S1 disc space identified and marked with an incision on the left 1cm lateral to midline.  Once this was complete a 2 cm incision was opened with the use of a #10 blade knife.  The Metrx tubes were sequentially advanced under lateral fluoroscopy until a 18 x 70 mm Metrx tube was placed over the facet and lamina and secured to the bed.     The microscope was then sterilely brought into the field and muscle creep was hemostased with a bipolar and resected with a pituitary rongeur.  A Bovie extender was then used to expose the spinous process and lamina.  Careful attention was placed to not violate the facet capsule. The prior laminotomy was identified. Superior to the prior laminotomy, the 3 mm matchstick drill bit was then used to make a hemi-laminotomy trough until the ligamentum flavum was exposed.  This was extended to the base of the spinous process.  Once this was complete and the underlying ligamentum flavum was visualized, the ligamentum was dissected with an up angle curette and resected with a #2 and #3 mm biting Kerrison.  The laminotomy opening was also expanded in similar fashion and hemostasis was obtained with Surgifoam and a patty as well as bone wax.  The epidural plane was developed superior to the prior discectomy.  Once the underlying dura was visualized a Penfield 4 was then used to dissect and expose the traversing nerve root.  Once this was identified a nerve root retractor suction was used to mobilize this medially.  The venous plexus was hemostased with Surgifoam and light bipolar use.  A small penfied was then used to make a small annulotomy within the disc space and disc space contents were noted to come through the annulus.    The disc herniation was identified and dissected free using a balltip probe. The pituitary rongeur was used to remove the extruded disc fragments. Once the thecal sac and nerve root were noted to be relaxed and under less tension the ball-tipped feeler was passed along the foramen distally to ensure no residual compression was noted.    Depo-Medrol was placed along the nerve root.  The  area was irrigated. The tube system was then removed under microscopic visualization and hemostasis was obtained with a bipolar.    The fascial layer was reapproximated with the use of a 0- Vicryl suture.   Subcutaneous tissue layer was reapproximated using 2-0 Vicryl suture.  3-0 monocryl was used on the skin. The skin was then cleansed and Dermabond was used to close the skin opening.  Patient was then rotated back to the preoperative bed awakened from anesthesia and taken to recovery all counts are correct in this case.   I performed the entire procedure with the assistance of Manning Charity PA as an Designer, television/film set. An assistant was required for this procedure due to the complexity.  The assistant provided assistance in tissue manipulation and suction, and was required for the successful and safe performance of the procedure. I performed the critical portions of the procedure.   Venetia Night MD

## 2023-08-07 NOTE — Discharge Instructions (Addendum)
Your surgeon has performed an operation on your lumbar spine (low back) to relieve pressure on one or more nerves. Many times, patients feel better immediately after surgery and can "overdo it." Even if you feel well, it is important that you follow these activity guidelines. If you do not let your back heal properly from the surgery, you can increase the chance of a disc herniation and/or return of your symptoms. The following are instructions to help in your recovery once you have been discharged from the hospital.  * It is ok to take NSAIDs after surgery.  Activity    No bending, lifting, or twisting ("BLT"). Avoid lifting objects heavier than 10 pounds (gallon milk jug).  Where possible, avoid household activities that involve lifting, bending, pushing, or pulling such as laundry, vacuuming, grocery shopping, and childcare. Try to arrange for help from friends and family for these activities while your back heals.  Increase physical activity slowly as tolerated.  Taking short walks is encouraged, but avoid strenuous exercise. Do not jog, run, bicycle, lift weights, or participate in any other exercises unless specifically allowed by your doctor. Avoid prolonged sitting, including car rides.  Talk to your doctor before resuming sexual activity.  You should not drive until cleared by your doctor.  Until released by your doctor, you should not return to work or school.  You should rest at home and let your body heal.   You may shower three days after your surgery.  After showering, lightly dab your incision dry. Do not take a tub bath or go swimming for 3 weeks, or until approved by your doctor at your follow-up appointment.  If you smoke, we strongly recommend that you quit.  Smoking has been proven to interfere with normal healing in your back and will dramatically reduce the success rate of your surgery. Please contact [facility] (800-QUIT-NOW) and use the resources at www.[facility].com for  assistance in stopping smoking.  Surgical Incision   If you have a dressing on your incision, you may remove it three days after your surgery. Keep your incision area clean and dry.  If you have staples or stitches on your incision, you should have a follow up scheduled for removal. If you do not have staples or stitches, you will have steri-strips (small pieces of surgical tape) or Dermabond glue. The steri-strips/glue should begin to peel away within about a week (it is fine if the steri-strips fall off before then). If the strips are still in place one week after your surgery, you may gently remove them.  Diet            You may return to your usual diet. Be sure to stay hydrated.  When to Contact us  Although your surgery and recovery will likely be uneventful, you may have some residual numbness, aches, and pains in your back and/or legs. This is normal and should improve in the next few weeks.  However, should you experience any of the following, contact us immediately: New numbness or weakness Pain that is progressively getting worse, and is not relieved by your pain medications or rest Bleeding, redness, swelling, pain, or drainage from surgical incision Chills or flu-like symptoms Fever greater than 101.0 F (38.3 C) Problems with bowel or bladder functions Difficulty breathing or shortness of breath Warmth, tenderness, or swelling in your calf  Contact Information How to contact us:  If you have any questions/concerns before or after surgery, you can reach [place] at [phone], or you can  send a [employer]. We can be reached by phone or mychart 8am-4pm, Monday-Friday.  *Please note: Calls after 4pm are forwarded to a third party answering service. Mychart messages are not routinely monitored during evenings, weekends, and holidays. Please call our office to contact the answering service for urgent concerns during non-business hours.

## 2023-08-07 NOTE — Discharge Summary (Signed)
Discharge Summary  Patient ID: Martha Oconnor MRN: [id number] DOB/AGE: 12/24/1990 32 y.o.  Admit date: 08/07/2023 Discharge date: 08/07/2023  Admission Diagnoses:  M54.16 Lumbar radiculopathy, M51.26 Recurrent herniation of lumbar disc, R29.898 Left leg weakness  Discharge Diagnoses:  Active Problems:   Lumbar radiculopathy   Discharged Condition: good  Hospital Course:  Martha Oconnor is a 32 y.o presenting with recurrent lumbar radiculopathy status post left L5-S1 microdiscectomy.  Her intraoperative course was uncomplicated.  She was monitored in PACU and discharged home after ambulating, urinating, and tolerating p.o. intake.  She was given prescriptions for oxycodone, Robaxin, and senna to take as needed.  Consults: None  Significant Diagnostic Studies: NA  Treatments: surgery: as above.  Please see separately dictated operative report for further details.  Discharge Exam: Blood pressure (!) 150/95, pulse 95, temperature 98 F (36.7 C), temperature source Temporal, resp. rate 19, height 5\' 4"  (1.626 m), weight 77.1 kg, last menstrual period 07/12/2023, SpO2 98%. CN grossly intact MAEW Incision covered with clean post-op dressing  Disposition: Discharge disposition: 01-Home or Self Care        Allergies as of 08/07/2023       Reactions   Ceclor [cefaclor] Other (See Comments)   Unknown (rxn as young child)        Medication List     TAKE these medications    ibuprofen 800 MG tablet Commonly known as: ADVIL Take 1 tablet (800 mg total) by mouth every 8 (eight) hours as needed.   methocarbamol 500 MG tablet Commonly known as: ROBAXIN Take 1 tablet (500 mg total) by mouth 4 (four) times daily.   oxyCODONE 5 MG immediate release tablet Commonly known as: Roxicodone Take 1 tablet (5 mg total) by mouth every 4 (four) hours as needed for severe pain (pain score 7-10).   senna 8.6 MG Tabs tablet Commonly known as: SENOKOT Take 1 tablet (8.6 mg total) by  mouth 2 (two) times daily as needed for mild constipation.   Simpesse 0.15-0.03 &0.01 MG tablet Generic drug: Levonorgestrel-Ethinyl Estradiol TAKE 1 TABLET BY MOUTH EVERY NIGHT AT BEDTIME   Vyvanse 60 MG capsule Generic drug: lisdexamfetamine Take 60 mg by mouth every morning.        Follow-up Information     Susanne Borders, PA Follow up on 08/17/2023.   Specialty: Neurosurgery Contact information: [address] [place] [address] [phone]                 Signed: Susanne Borders 08/07/2023, 8:42 AM

## 2023-08-07 NOTE — Anesthesia Preprocedure Evaluation (Signed)
Anesthesia Evaluation  Patient identified by MRN, date of birth, ID band Patient awake    Reviewed: Allergy & Precautions, NPO status , Patient's Chart, lab work & pertinent test results  History of Anesthesia Complications Negative for: history of anesthetic complications  Airway Mallampati: II  TM Distance: >3 FB Neck ROM: full    Dental  (+) Chipped   Pulmonary neg shortness of breath, former smoker   Pulmonary exam normal        Cardiovascular Exercise Tolerance: Good (-) angina (-) Past MI negative cardio ROS Normal cardiovascular exam     Neuro/Psych  Headaches  Neuromuscular disease  negative psych ROS   GI/Hepatic negative GI ROS, Neg liver ROS,neg GERD  ,,  Endo/Other  negative endocrine ROS    Renal/GU      Musculoskeletal   Abdominal   Peds  Hematology negative hematology ROS (+)   Anesthesia Other Findings Past Medical History: No date: ADHD No date: Anxiety No date: Clotting disorder (HCC) 05/27/2021: Cyst of skin 2024: Degenerative disc disease, lumbar No date: Depression No date: Heart murmur No date: History of kidney stones No date: Lumbar disc herniation No date: Lumbar radiculitis No date: Marijuana use No date: Migraine No date: PTSD (post-traumatic stress disorder)  Past Surgical History: 11/15/2021: LUMBAR LAMINECTOMY/DECOMPRESSION MICRODISCECTOMY; Left     Comment:  Procedure: LEFT L5-S1 MICRODISCECTOMY;  Surgeon:               Venetia Night, MD;  Location: [facility] ORS;  Service:               Neurosurgery;  Laterality: Left; No date: TUBAL LIGATION  BMI    Body Mass Index: 29.18 kg/m      Reproductive/Obstetrics negative OB ROS                             Anesthesia Physical Anesthesia Plan  ASA: 2  Anesthesia Plan: General ETT   Post-op Pain Management:    Induction: Intravenous  PONV Risk Score and Plan: Ondansetron, Dexamethasone,  Midazolam and Treatment may vary due to age or medical condition  Airway Management Planned: Oral ETT  Additional Equipment:   Intra-op Plan:   Post-operative Plan: Extubation in OR  Informed Consent: I have reviewed the patients History and Physical, chart, labs and discussed the procedure including the risks, benefits and alternatives for the proposed anesthesia with the patient or authorized representative who has indicated his/her understanding and acceptance.     Dental Advisory Given  Plan Discussed with: Anesthesiologist, CRNA and Surgeon  Anesthesia Plan Comments: (Patient consented for risks of anesthesia including but not limited to:  - adverse reactions to medications - damage to eyes, teeth, lips or other oral mucosa - nerve damage due to positioning  - sore throat or hoarseness - Damage to heart, brain, nerves, lungs, other parts of body or loss of life  Patient voiced understanding and assent.)       Anesthesia Quick Evaluation

## 2023-08-07 NOTE — Progress Notes (Signed)
Patient walked to the bathroom,urinated,ate and drank without difficulty. Patient discharged home.

## 2023-08-07 NOTE — Anesthesia Procedure Notes (Signed)
Procedure Name: Intubation Date/Time: 08/07/2023 7:23 AM  Performed by: Katherine Basset, CRNAPre-anesthesia Checklist: Patient identified, Emergency Drugs available, Suction available and Patient being monitored Patient Re-evaluated:Patient Re-evaluated prior to induction Oxygen Delivery Method: Circle system utilized Preoxygenation: Pre-oxygenation with 100% oxygen Induction Type: IV induction Laryngoscope Size: Miller and 2 Grade View: Grade I Tube type: Oral Tube size: 7.0 mm Number of attempts: 1 Airway Equipment and Method: Stylet and Oral airway Placement Confirmation: ETT inserted through vocal cords under direct vision, positive ETCO2 and breath sounds checked- equal and bilateral Secured at: 20 cm Tube secured with: Tape Dental Injury: Teeth and Oropharynx as per pre-operative assessment

## 2023-08-07 NOTE — Interval H&P Note (Signed)
History and Physical Interval Note:  08/07/2023 6:57 AM  Martha Oconnor  has presented today for surgery, with the diagnosis of M54.16 Lumbar radiculopathy M51.26 Recurrent herniation of lumbar disc R29.898 Left leg weakness.  The various methods of treatment have been discussed with the patient and family. After consideration of risks, benefits and other options for treatment, the patient has consented to  Procedure(s): LEFT L5-S1 DISCECTOMY (Left) as a surgical intervention.  The patient's history has been reviewed, patient examined, no change in status, stable for surgery.  I have reviewed the patient's chart and labs.  Questions were answered to the patient's satisfaction.    Heart sounds normal no MRG. Chest Clear to Auscultation Bilaterally.   Zoltan Genest

## 2023-08-07 NOTE — Anesthesia Postprocedure Evaluation (Signed)
Anesthesia Post Note  Patient: Martha Oconnor  Procedure(s) Performed: LEFT L5-S1 DISCECTOMY (Left: Spine Lumbar)  Patient location during evaluation: PACU Anesthesia Type: General Level of consciousness: awake and alert Pain management: pain level controlled Vital Signs Assessment: post-procedure vital signs reviewed and stable Respiratory status: spontaneous breathing, nonlabored ventilation, respiratory function stable and patient connected to nasal cannula oxygen Cardiovascular status: blood pressure returned to baseline and stable Postop Assessment: no apparent nausea or vomiting Anesthetic complications: no   No notable events documented.   Last Vitals:  Vitals:   08/07/23 0919 08/07/23 0931  BP:  112/71  Pulse: 74 71  Resp: 18 18  Temp: 36.7 C (!) 36.2 C  SpO2: 100% 100%    Last Pain:  Vitals:   08/07/23 0931  TempSrc:   PainSc: 0-No pain                 Cleda Mccreedy Samrat Hayward

## 2023-08-08 ENCOUNTER — Encounter: Payer: Self-pay | Admitting: Neurosurgery

## 2023-08-17 ENCOUNTER — Ambulatory Visit: Payer: MEDICAID | Admitting: Neurosurgery

## 2023-08-17 ENCOUNTER — Encounter: Payer: Self-pay | Admitting: Neurosurgery

## 2023-08-17 VITALS — BP 122/84 | Ht 64.0 in | Wt 170.0 lb

## 2023-08-17 DIAGNOSIS — Z9889 Other specified postprocedural states: Secondary | ICD-10-CM

## 2023-08-17 DIAGNOSIS — M5416 Radiculopathy, lumbar region: Secondary | ICD-10-CM

## 2023-08-17 NOTE — Progress Notes (Signed)
   REFERRING PHYSICIAN:  Channing Mutters [address] [place],  [place] [address]  DOS: 08/07/23 left L5-S1 redo microdiscectomy  HISTORY OF PRESENT ILLNESS: Martha Oconnor is approximately 2 weeks status post redo discectomy. she is doing well.  She reports complete resolution of her radiating leg pain and significant improvement in her foot weakness.  She does still have some numbness in her foot but feels this is very tolerable.  PHYSICAL EXAMINATION:  General: Patient is well developed, well nourished, calm, collected, and in no apparent distress.   NEUROLOGICAL:  General: In no acute distress.   Awake, alert, oriented to person, place, and time.  Pupils equal round and reactive to light.   Strength:            Side Iliopsoas Quads Hamstring PF DF EHL  R 5 5 5 5 5 5   L 5 5 5 5 5 5    Incision c/d/i and healing well   ROS (Neurologic):  Negative except as noted above  IMAGING: No interval imaging to review  ASSESSMENT/PLAN:  Martha Oconnor is doing well approximately 2 weeks after microdiscectomy.  She has made a complete recovery of her foot weakness postoperatively and has had resolution of her pain.  We discussed realistic expectations surrounding her ongoing numbness.  We discussed activity escalation and I have advised the patient to lift up to 10 pounds until 6 weeks after surgery, then increase up to 25 pounds until 12 weeks after surgery.  After 12 weeks post-op, the patient advised to increase activity as tolerated. she will follow up with Dr. Myer Haff in 4 weeks or sooner should she have any questions or concerns.  She expressed understanding and was in agreement with this plan.  Manning Charity PA-C Department of neurosurgery

## 2023-09-11 ENCOUNTER — Encounter: Payer: Self-pay | Admitting: Neurosurgery

## 2023-09-19 ENCOUNTER — Ambulatory Visit (INDEPENDENT_AMBULATORY_CARE_PROVIDER_SITE_OTHER): Payer: MEDICAID | Admitting: Neurosurgery

## 2023-09-19 ENCOUNTER — Encounter: Payer: Self-pay | Admitting: Neurosurgery

## 2023-09-19 VITALS — BP 116/76 | Ht 64.0 in | Wt 170.0 lb

## 2023-09-19 DIAGNOSIS — M5416 Radiculopathy, lumbar region: Secondary | ICD-10-CM

## 2023-09-19 DIAGNOSIS — Z09 Encounter for follow-up examination after completed treatment for conditions other than malignant neoplasm: Secondary | ICD-10-CM

## 2023-09-19 NOTE — Progress Notes (Signed)
   REFERRING PHYSICIAN:  Channing Mutters [address] [place],  [place] [address]  DOS: 08/07/23 left L5-S1 redo microdiscectomy  HISTORY OF PRESENT ILLNESS: Martha Oconnor is status post redo discectomy. she is doing well.  She is having minimal pain.     PHYSICAL EXAMINATION:  General: Patient is well developed, well nourished, calm, collected, and in no apparent distress.   NEUROLOGICAL:  General: In no acute distress.   Awake, alert, oriented to person, place, and time.  Pupils equal round and reactive to light.   Strength:            Side Iliopsoas Quads Hamstring PF DF EHL  R 5 5 5 5 5 5   L 5 5 5 5 5 5    Incision c/d/i and healing well   ROS (Neurologic):  Negative except as noted above  IMAGING: No interval imaging to review  ASSESSMENT/PLAN:  Martha Oconnor is doing well after microdiscectomy.  I am very pleased with her improvements.  Her weakness is improved.  I will release her back to work.  She is on a 25 pound lifting limit until the end of February.    Venetia Night MD Department of neurosurgery

## 2023-10-21 NOTE — Progress Notes (Unsigned)
   REFERRING PHYSICIAN:  Channing Mutters [address] [place],  [place] [address]  DOS: 08/07/23 left L5-S1 redo microdiscectomy  DOS: 11/15/21 left L5-S1 microdiscectomy   HISTORY OF PRESENT ILLNESS:  She was doing well at her last visit with minimal pain and improvement in her weakness.   She has some tightness in left hamstring. She notes tingling in her toes on left. Her preop back and left leg pain are gone. Overall, she is doing great. 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    PHYSICAL EXAMINATION:  General: Patient is well developed, well nourished, calm, collected, and in no apparent distress.   NEUROLOGICAL:  General: In no acute distress.   Awake, alert, oriented to person, place, and time.  Pupils equal round and reactive to light.   Strength:            Side Iliopsoas Quads Hamstring PF DF EHL  R 5 5 5 5 5 5   L 5 5 5 5 5 5    Incision well healed   ROS (Neurologic):  Negative except as noted above  IMAGING: No interval imaging to review  ASSESSMENT/PLAN:  Martha Oconnor is doing well after microdiscectomy. Treatment options reviewed with patient and following plan made:   - She can return to activity as tolerated. Will still avoid heavy lifting.  - Given lumbar HEP do to on her own. If no improvement with this, will consider formal PT. She will let us know.  - She will follow up prn.   Drake Leach PA-C Department of neurosurgery

## 2023-10-26 ENCOUNTER — Encounter: Payer: Self-pay | Admitting: Orthopedic Surgery

## 2023-10-26 ENCOUNTER — Ambulatory Visit (INDEPENDENT_AMBULATORY_CARE_PROVIDER_SITE_OTHER): Payer: MEDICAID | Admitting: Orthopedic Surgery

## 2023-10-26 VITALS — BP 136/78 | Temp 98.6°F | Ht 64.0 in | Wt 170.0 lb

## 2023-10-26 DIAGNOSIS — Z09 Encounter for follow-up examination after completed treatment for conditions other than malignant neoplasm: Secondary | ICD-10-CM

## 2023-10-26 DIAGNOSIS — M5126 Other intervertebral disc displacement, lumbar region: Secondary | ICD-10-CM

## 2023-10-26 DIAGNOSIS — Z9889 Other specified postprocedural states: Secondary | ICD-10-CM

## 2023-11-01 ENCOUNTER — Ambulatory Visit: Payer: Self-pay | Admitting: Nurse Practitioner

## 2023-11-01 NOTE — Telephone Encounter (Signed)
 Any advice to give patient before appointment on Friday?

## 2023-11-01 NOTE — Telephone Encounter (Signed)
 This RN made 1st attempt to reach pt, dialed x2 line was bus, then rcvd "call cannot be completed as dialed" message.   Copied from CRM [id number]. Topic: Clinical - Medical Advice >> Nov 01, 2023 10:29 AM Martha Oconnor wrote: Reason for CRM: Patient is calling in because she believes she has a kidney stone and wanted advice on what she can do until her appointment on 11/03/23

## 2023-11-01 NOTE — Telephone Encounter (Signed)
 This RN made third attempt to contact pt with recording stating, "This call could not be completed as dialed."

## 2023-11-03 ENCOUNTER — Ambulatory Visit: Payer: MEDICAID | Admitting: Nurse Practitioner

## 2023-11-03 DIAGNOSIS — R109 Unspecified abdominal pain: Secondary | ICD-10-CM

## 2023-11-06 ENCOUNTER — Ambulatory Visit: Payer: MEDICAID | Admitting: Nurse Practitioner

## 2023-11-06 DIAGNOSIS — R109 Unspecified abdominal pain: Secondary | ICD-10-CM

## 2023-11-14 IMAGING — RF DG LUMBAR SPINE 2-3V
1 series · 4 of 4 positions shown · non-contrast
Comparison: None.

CLINICAL DATA: Fluoroscopic assistance for lumbar spine surgery

EXAM:
LUMBAR SPINE - 2-3 VIEW

[Series 1: dg x-ray · 0.20mm/px · 4 of 4 slices shown]
[im 1/4]
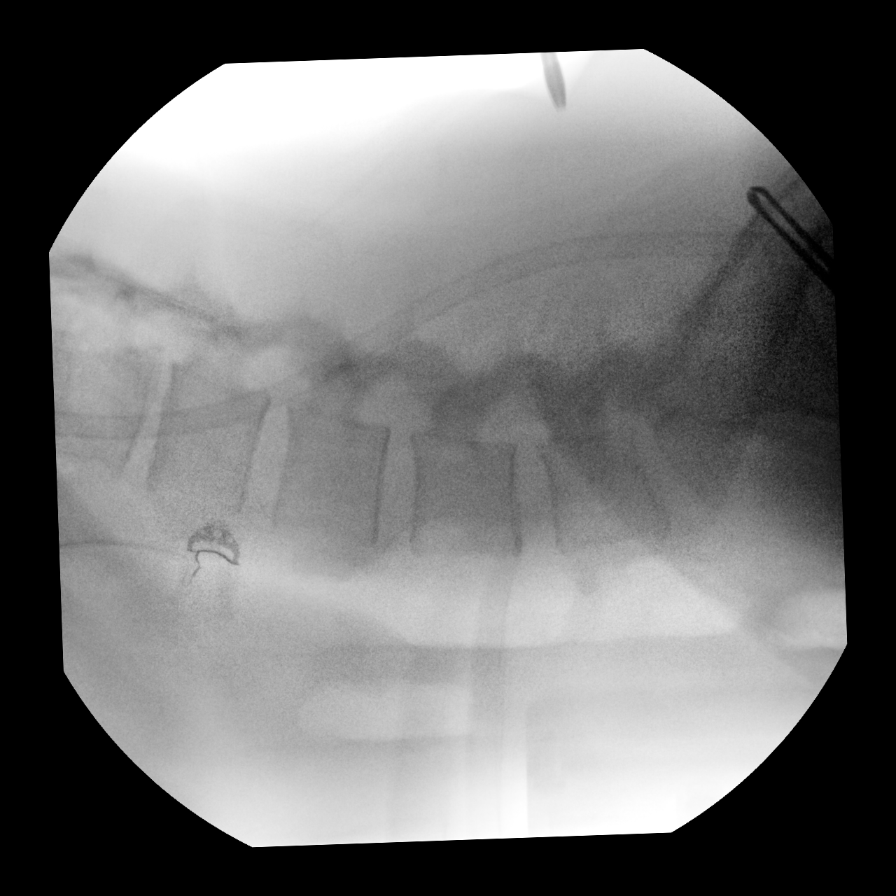
[im 2/4]
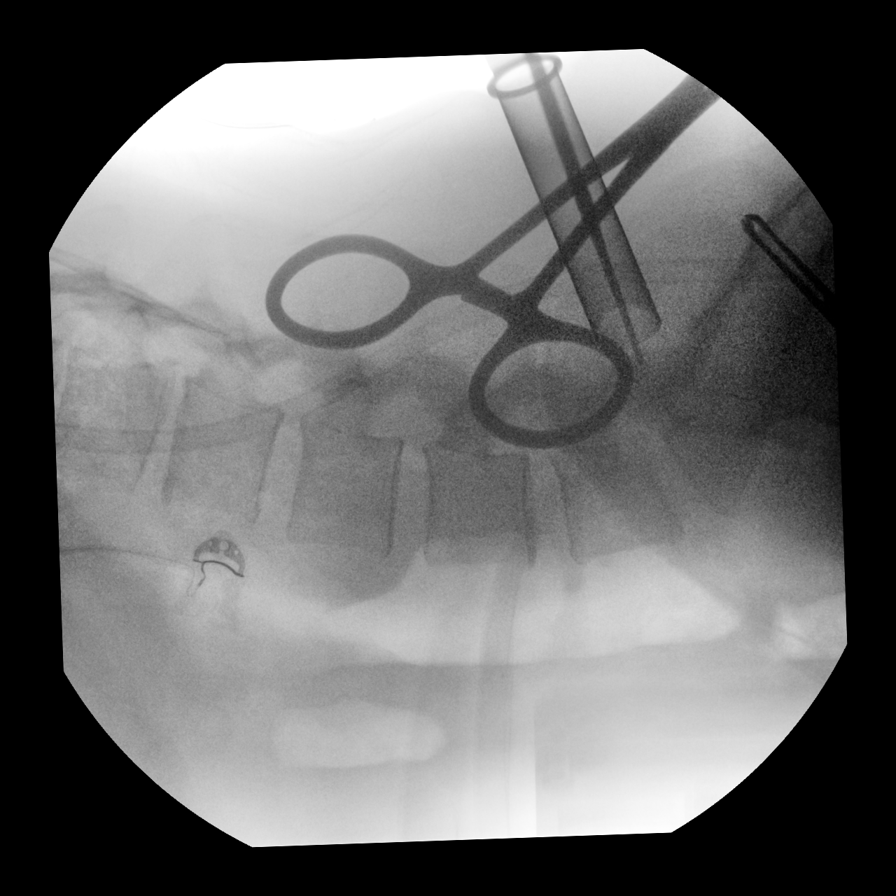
[im 3/4]
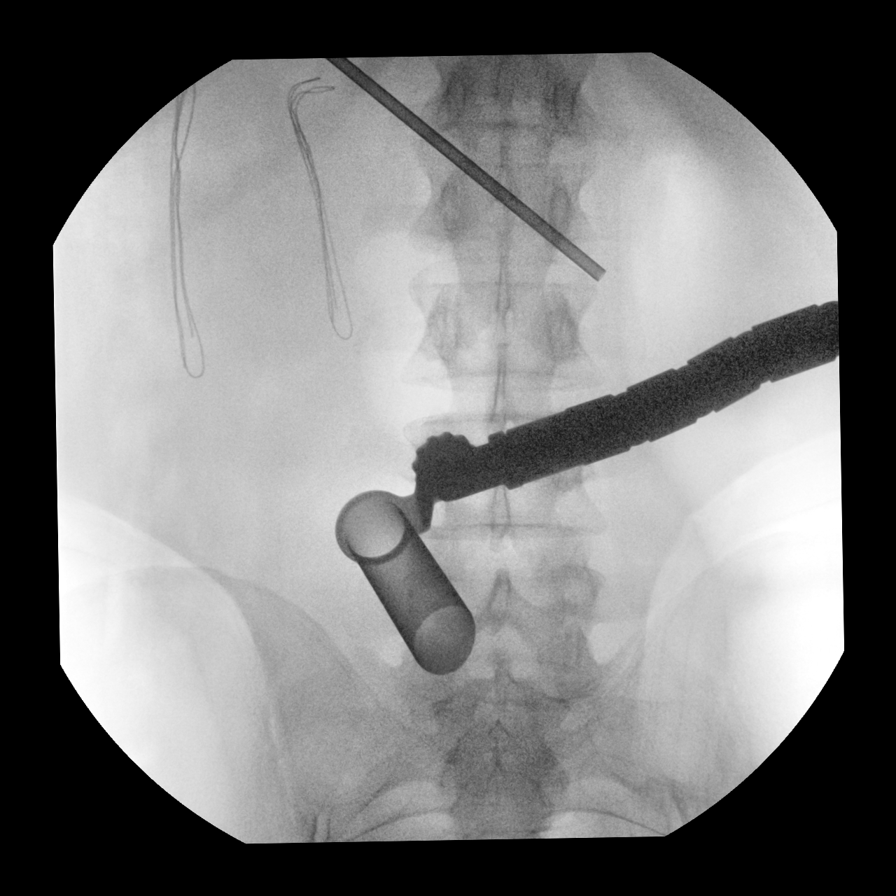
[im 4/4]
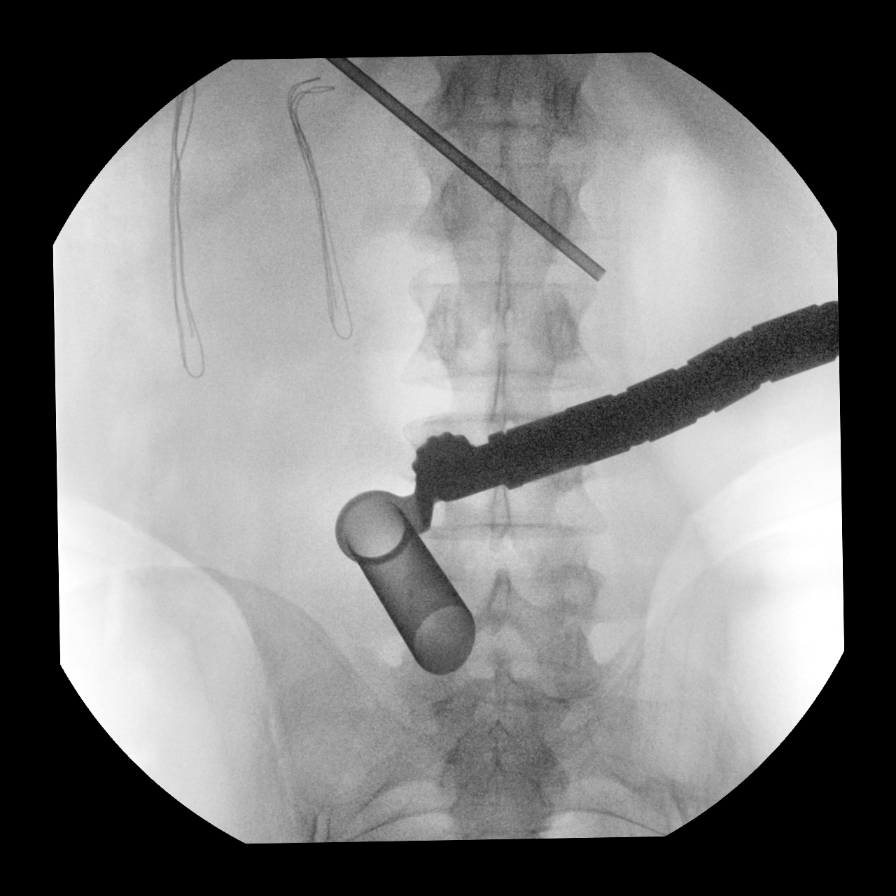

[4 of 4 positions shown; findings below may reference images not displayed]

FINDINGS: Fluoroscopic images were obtained for intraoperative localization.
Fluoroscopic time was 5 seconds. Radiation dose is 1.9 mGy.
IMPRESSION: Fluoroscopic assistance was provided for lumbar spine surgery at
L5-S1 level.

## 2024-04-17 ENCOUNTER — Ambulatory Visit: Payer: Self-pay

## 2024-04-17 ENCOUNTER — Ambulatory Visit: Payer: MEDICAID | Admitting: Pediatrics

## 2024-04-17 NOTE — Telephone Encounter (Signed)
 FYI Only or Action Required?: FYI only for provider.  Patient was last seen in primary care on 08/04/2023 by Valerio Melanie DASEN, NP.  Called Nurse Triage reporting Foot Pain.  Symptoms began today.  Interventions attempted: OTC medications: ibuprofen .  Symptoms are: unchanged.  Triage Disposition: See HCP Within 4 Hours (Or PCP Triage)  Patient/caregiver understands and will follow disposition?: Yes     Copied from CRM #[id number]. Topic: Clinical - Red Word Triage >> Apr 17, 2024  9:54 AM Precious C wrote: [facility] that prompted transfer to Nurse Triage: SEVERE PAIN/SWELLING    Pt called in due to pain in right foot and swelling as well which she is unable to walk, pt is currently at work which require her to walk constantly. Pain is 3-8/10 Reason for Disposition  [1] SEVERE pain (e.g., excruciating, unable to do any normal activities) AND [2] not improved after 2 hours of pain medicine  Answer Assessment - Initial Assessment Questions 1. ONSET: When did the pain start?      today 2. LOCATION: Where is the pain located?      Right foot under the arch then goes around  3. PAIN: How bad is the pain?    (Scale 1-10; or mild, moderate, severe)     3-4/10 without pressure 7-8 when walking 4. WORK OR EXERCISE: Has there been any recent work or exercise that involved this part of the body?      no 5. CAUSE: What do you think is causing the foot pain?     unknown 6. OTHER SYMPTOMS: Do you have any other symptoms? (e.g., leg pain, rash, fever, numbness)     no  Protocols used: Foot Pain-A-AH

## 2024-05-05 ENCOUNTER — Other Ambulatory Visit: Payer: Self-pay

## 2024-05-05 ENCOUNTER — Encounter: Payer: Self-pay | Admitting: Emergency Medicine

## 2024-05-05 ENCOUNTER — Emergency Department
Admission: EM | Admit: 2024-05-05 | Discharge: 2024-05-06 | Disposition: A | Discharge status: Home / Self Care | Payer: MEDICAID | Attending: Emergency Medicine | Admitting: Emergency Medicine

## 2024-05-05 DIAGNOSIS — M542 Cervicalgia: Secondary | ICD-10-CM | POA: Diagnosis present

## 2024-05-05 DIAGNOSIS — M5412 Radiculopathy, cervical region: Secondary | ICD-10-CM | POA: Diagnosis not present

## 2024-05-05 LAB — CBC
HCT: 43 % (ref 36.0–46.0)
Hemoglobin: 14.3 g/dL (ref 12.0–15.0)
MCH: 30.9 pg (ref 26.0–34.0)
MCHC: 33.3 g/dL (ref 30.0–36.0)
MCV: 92.9 fL (ref 80.0–100.0)
Platelets: 313 K/uL (ref 150–400)
RBC: 4.63 MIL/uL (ref 3.87–5.11)
RDW: 12.8 % (ref 11.5–15.5)
WBC: 10.6 K/uL — ABNORMAL HIGH (ref 4.0–10.5)
nRBC: 0 % (ref 0.0–0.2)

## 2024-05-05 LAB — BASIC METABOLIC PANEL WITH GFR
Anion gap: 8 (ref 5–15)
BUN: 15 mg/dL (ref 6–20)
CO2: 26 mmol/L (ref 22–32)
Calcium: 9.4 mg/dL (ref 8.9–10.3)
Chloride: 101 mmol/L (ref 98–111)
Creatinine, Ser: 0.62 mg/dL (ref 0.44–1.00)
GFR, Estimated: >60 mL/min (ref >60)
Glucose, Bld: 91 mg/dL (ref 70–99)
Potassium: 3.8 mmol/L (ref 3.5–5.1)
Sodium: 135 mmol/L (ref 135–145)

## 2024-05-05 MED ORDER — OXYCODONE-ACETAMINOPHEN 5-325 MG PO TABS: 1.0000 | ORAL_TABLET | ORAL | 0 refills | Status: DC | PRN

## 2024-05-05 MED ORDER — GABAPENTIN 100 MG PO CAPS: 200.0000 mg | ORAL_CAPSULE | Freq: Three times a day (TID) | ORAL | 0 refills | Status: DC

## 2024-05-05 MED ORDER — HYDROMORPHONE HCL 1 MG/ML IJ SOLN
1.0000 mg | Freq: Once | INTRAMUSCULAR | Status: AC
  Administered 2024-05-05: 1 mg via INTRAMUSCULAR
  Filled 2024-05-05: qty 1

## 2024-05-05 MED ORDER — PREDNISONE 10 MG PO TABS: 10.0000 mg | ORAL_TABLET | Freq: Every day | ORAL | 0 refills | Status: DC

## 2024-05-05 MED ORDER — PREDNISONE 20 MG PO TABS
60.0000 mg | ORAL_TABLET | Freq: Once | ORAL | Status: AC
  Administered 2024-05-05: 60 mg via ORAL
  Filled 2024-05-05: qty 3

## 2024-05-05 NOTE — ED Triage Notes (Addendum)
 Patient reports headache and neck pain since Tuesday. Reports shooting pain left arm. Denies injury. Seen by chiropractor and reports some relief. Trying home remedies with minimal relief. No fevers. History of back surgery for herniated discs. Denies vision changes.

## 2024-05-05 NOTE — ED Provider Notes (Signed)
 [facility] Provider Note    Event Date/Time   First MD Initiated Contact with Patient 05/05/24 2316     (approximate)  History   Chief Complaint: Headache and Neck Pain  HPI  Martha Oconnor is a 33 y.o. female with a past medical history of ADHD, anxiety, prior back surgery, presents to the emergency department for left neck pain radiating into the left arm.  According to the patient 5 days ago she developed pain in the left neck that progressively worsened.  Patient describes it as pain from the mid neck shooting down into the left shoulder and at times a sharp pain shooting down into the left arm.  Pain is worse with movement of the neck.  Patient states she has had pinched nerves in her back before due to slipped disc per patient.  Has had a discectomy performed by Dr. Katrina previously in her lower back.  Patient states she did not know what caused the neck pain this time had no event that occurred.  Patient states she has tried taking over-the-counter medications, over the last few days she took a Percocet she had leftover with no relief.  Patient saw her chiropractor 2 days ago states it helped some, but she still had pain so she came to the emergency department.  Physical Exam   Triage Vital Signs: ED Triage Vitals  Encounter Vitals Group     BP 05/05/24 2212 (!) 144/118     Girls Systolic BP Percentile --      Girls Diastolic BP Percentile --      Boys Systolic BP Percentile --      Boys Diastolic BP Percentile --      Pulse Rate 05/05/24 2212 88     Resp 05/05/24 2212 16     Temp 05/05/24 2212 97.9 F (36.6 C)     Temp Source 05/05/24 2212 Oral     SpO2 05/05/24 2212 100 %     Weight 05/05/24 2211 175 lb (79.4 kg)     Height 05/05/24 2211 5' 4 (1.626 m)     Head Circumference --      Peak Flow --      Pain Score 05/05/24 2211 10     Pain Loc --      Pain Education --      Exclude from Growth Chart --     Most recent vital  signs: Vitals:   05/05/24 2212  BP: (!) 144/118  Pulse: 88  Resp: 16  Temp: 97.9 F (36.6 C)  SpO2: 100%    General: Awake, no distress.  CV:  Good peripheral perfusion.  Regular rate and rhythm  Resp:  Normal effort.  Equal breath sounds bilaterally. Abd:  No distention.   Other:  Patient has equal grip strength bilaterally, 5/5 strength in upper extremities.  Patient has significant tenderness to palpation in the left side paraspinal muscles of the cervical spine into the trapezius.   ED Results / Procedures / Treatments   MEDICATIONS ORDERED IN ED: Medications  HYDROmorphone  (DILAUDID ) injection 1 mg (has no administration in time range)  predniSONE  (DELTASONE ) tablet 60 mg (has no administration in time range)     IMPRESSION / MDM / ASSESSMENT AND PLAN / ED COURSE  I reviewed the triage vital signs and the nursing notes.  Patient's presentation is most consistent with acute presentation with potential threat to life or bodily function.  Patient presents to the emergency department for left-sided neck  pain.  Patient symptoms seem very consistent with cervical radiculopathy.  Pain is exacerbated by movements of the neck she describes a sharp shooting type pain into the left shoulder and into the left arm at times.  Patient states she has tried multiple medications at home without relief so she came to the emergency department.  Reassuringly good physical exam and neurological exam including great grip strength bilaterally with equal strength.  No numbness.  Patient's lab work shows a reassuring CBC, reassuring chemistry.  I spoke to the patient regarding the likelihood of cervical radiculopathy.  We will treat pain in the emergency department with a shot of pain medication and start the patient on prednisone .  I discussed a prednisone  taper, starting the patient on gabapentin  as well as a short course of pain medication.  Patient is already established with Dr. Katrina of  neurosurgery, discussed follow-up with neurosurgery for further workup and treatment should the pain continue.  Patient is visibly upset by this, states she would not of come to the emergency department if we are not going to figure out what is going on.  I spoke to the patient's stating that with cervical radiculopathy the typical course would be neck pain for 2 to 4 weeks although should be improving over time.  Again patient upset by this.  However on my examination there were no concerning findings no red flags no concerning for canal compression and I believe the best course of care would be steroids gabapentin  and neurosurgery follow-up.  Patient states she is feeling better after medications.  Ultimately I did offer to obtain an MRI tonight.  Patient is much more calm now that she is not in pain and states she would prefer to hold off and speak to Dr. Katrina which I believe is reasonable and we will prescribe the medications we discussed.  FINAL CLINICAL IMPRESSION(S) / ED DIAGNOSES   Cervical radiculopathy    Note:  This document was prepared using Dragon voice recognition software and may include unintentional dictation errors.   Dorothyann Drivers, MD 05/05/24 7664    Dorothyann Drivers, MD 05/06/24 [phone]

## 2024-05-06 MED ORDER — LIDOCAINE 5 % EX PTCH
1.0000 | MEDICATED_PATCH | CUTANEOUS | Status: DC
  Administered 2024-05-06: 1 via TRANSDERMAL
  Filled 2024-05-06: qty 1

## 2024-05-06 MED ORDER — LIDOCAINE 5 % EX PTCH
1.0000 | MEDICATED_PATCH | CUTANEOUS | 0 refills | Status: AC
Start: 2024-05-06 — End: 2024-05-16

## 2024-06-10 ENCOUNTER — Encounter: Payer: Self-pay | Admitting: Physician Assistant

## 2024-06-10 ENCOUNTER — Other Ambulatory Visit: Payer: MEDICAID

## 2024-06-10 ENCOUNTER — Ambulatory Visit (INDEPENDENT_AMBULATORY_CARE_PROVIDER_SITE_OTHER): Payer: MEDICAID | Admitting: Physician Assistant

## 2024-06-10 VITALS — BP 136/86 | Ht 64.0 in | Wt 187.0 lb

## 2024-06-10 DIAGNOSIS — M5412 Radiculopathy, cervical region: Secondary | ICD-10-CM | POA: Diagnosis not present

## 2024-06-10 DIAGNOSIS — M5010 Cervical disc disorder with radiculopathy, unspecified cervical region: Secondary | ICD-10-CM

## 2024-06-10 MED ORDER — METHYLPREDNISOLONE 4 MG PO TBPK: ORAL_TABLET | ORAL | 0 refills | Status: DC

## 2024-06-10 MED ORDER — GABAPENTIN 300 MG PO CAPS: 300.0000 mg | ORAL_CAPSULE | Freq: Three times a day (TID) | ORAL | 2 refills | Status: DC

## 2024-06-10 NOTE — Progress Notes (Signed)
 Referring Physician:  Valerio Melanie DASEN, NP [address] [place],  [place] [address]  Primary Physician:  Martha Melanie DASEN, NP  History of Present Illness: 06/10/2024 [name] is here today with a chief complaint of neck pain radiating to her left upper extremity x 8 weeks.  She denies any inciting event and states that she woke up with 1 morning with horrible pain primarily between her shoulder blades that was going down her left arm.  This continued, she eventually went to the ED was given prednisone .  She has been seeing a chiropractor, but her pain has persisted and she is concerned because she feels that her left arm is getting progressively weaker.  She states that is constant tingling, numbness, weakness in her left arm and she often has to hold her left arm above her head to get any kind of relief.  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  She is right-hand dominant.  Denies any issues with gait instability or changes to incontinence.     Weakness: none Bowel/Bladder Dysfunction: none  Conservative measures:  Multimodal medical therapy including regular antiinflammatories:  Injections:  epidural steroid injections    Martha Oconnor has no symptoms of cervical myelopathy.  The symptoms are causing a significant impact on the patient's life.   Review of Systems:  A 10 point review of systems is negative, except for the pertinent positives and negatives detailed in the HPI.  Past Medical History: Past Medical History:  Diagnosis Date   ADHD    Anxiety    Clotting disorder    Cyst of skin 05/27/2021   Degenerative disc disease, lumbar 2024   Depression    Heart murmur    History of kidney stones    Lumbar disc herniation    Lumbar radiculitis    Marijuana use    Migraine    PTSD (post-traumatic stress disorder)     Past Surgical History: Past Surgical History:  Procedure Laterality Date   LUMBAR  LAMINECTOMY/DECOMPRESSION MICRODISCECTOMY Left 11/15/2021   Procedure: LEFT L5-S1 MICRODISCECTOMY;  Surgeon: Clois Fret, MD;  Location: [facility] ORS;  Service: Neurosurgery;  Laterality: Left;   LUMBAR LAMINECTOMY/DECOMPRESSION MICRODISCECTOMY Left 08/07/2023   Procedure: LEFT L5-S1 DISCECTOMY;  Surgeon: Clois Fret, MD;  Location: [facility] ORS;  Service: Neurosurgery;  Laterality: Left;   TUBAL LIGATION      Allergies: Allergies as of 06/10/2024 - Review Complete 06/10/2024  Allergen Reaction Noted   Ceclor [cefaclor] Other (See Comments) 01/27/2015    Medications: Outpatient Encounter Medications as of 06/10/2024  Medication Sig   gabapentin  (NEURONTIN ) 100 MG capsule Take 2 capsules (200 mg total) by mouth 3 (three) times daily.   VYVANSE 60 MG capsule Take 60 mg by mouth every morning.   [DISCONTINUED] Levonorgestrel-Ethinyl Estradiol (SIMPESSE) 0.15-0.03 &0.01 MG tablet TAKE 1 TABLET BY MOUTH EVERY NIGHT AT BEDTIME   [DISCONTINUED] methocarbamol  (ROBAXIN ) 500 MG tablet Take 1 tablet (500 mg total) by mouth 4 (four) times daily.   [DISCONTINUED] oxyCODONE -acetaminophen  (PERCOCET) 5-325 MG tablet Take 1 tablet by mouth every 4 (four) hours as needed for severe pain (pain score 7-10).   [DISCONTINUED] predniSONE  (DELTASONE ) 10 MG tablet Take 1 tablet (10 mg total) by mouth daily. Day 1-3: take 4 tablets PO daily Day 4-6: take 3 tablets PO daily Day 7-9: take 2 tablets PO daily Day 10-12: take 1 tablet PO daily   No facility-administered encounter medications on  file as of 06/10/2024.    Social History: Social History   Tobacco Use   Smoking status: Former    Current packs/day: 0.00    Types: Cigarettes    Start date: 05/28/2005    Quit date: 05/28/2013    Years since quitting: 11.0   Smokeless tobacco: Never  Vaping Use   Vaping status: Former   Substances: THC  Substance Use Topics   Alcohol use: Yes    Comment: occ.   Drug use: Yes    Types: Marijuana    Comment:  just with migraines    Family Medical History: No family history on file.  Physical Examination: @VITALWITHPAIN @  General: Patient is well developed, well nourished, calm, collected, and in no apparent distress. Attention to examination is appropriate.  Psychiatric: Patient is non-anxious.  Head:  Pupils equal, round, and reactive to light.  ENT:  Oral mucosa appears well hydrated.  Neck:   Supple.    Respiratory: Patient is breathing without any difficulty.  Extremities: No edema.  Vascular: Palpable dorsal pedal pulses.  Skin:   On exposed skin, there are no abnormal skin lesions.  NEUROLOGICAL:     Awake, alert, oriented to person, place, and time.  Speech is clear and fluent. Fund of knowledge is appropriate.   Cranial Nerves: Pupils equal round and reactive to light.  Facial tone is symmetric.   ROM of spine: Tenderness to palpation of cervical paraspinals.  Positive Spurling's.   Strength: Some giveaway weakness noted. Side Biceps Triceps Deltoid Interossei Grip Wrist Ext. Wrist Flex.  R 5 5 5 5 5 5 5   L 5 4- 4 4 4- 4- 4    Reflexes are 2+ and symmetric at the biceps, triceps, brachioradialis. Hoffman's is absent.  Bilateral upper and lower extremity sensation is intact to light touch, but slight decrease sensation in left upper extremity Gait is normal.   No difficulty with tandem gait.   No evidence of dysmetria noted.  Medical Decision Making  Imaging: No recent imaging of cervical spine.   Assessment and Plan: [name] is a pleasant 33 y.o. female with acute left-sided cervical radiculopathy and associated numbness, tingling, weakness in her left upper extremity.  Plan moving forward includes the following:  -Increase gabapentin  to 300 mg 3 times a day - Another trial of steroids.  Medrol  Dosepak given.  Risk and benefits of both gabapentin  and Medrol  Dosepak were discussed with the patient at length.  She expresses understanding and agrees to  continue. - X-ray today including flexion-extension of cervical spine. - Physical therapy referral has been placed. - MRI for evaluation of cervical spine.  Will review results once complete.  Will plan for follow-up with previous surgeon, Dr. Katrina.   Thank you for involving me in the care of this patient.    Lyle Decamp, PA-C Dept. of Neurosurgery

## 2024-06-11 ENCOUNTER — Encounter: Payer: Self-pay | Admitting: Physician Assistant

## 2024-06-13 ENCOUNTER — Ambulatory Visit
Admission: RE | Admit: 2024-06-13 | Discharge: 2024-06-13 | Disposition: A | Discharge status: Home / Self Care | Payer: MEDICAID | Source: Ambulatory Visit | Attending: Physician Assistant | Admitting: Physician Assistant

## 2024-06-13 DIAGNOSIS — M5010 Cervical disc disorder with radiculopathy, unspecified cervical region: Secondary | ICD-10-CM

## 2024-06-14 ENCOUNTER — Encounter: Payer: Self-pay | Admitting: Physician Assistant

## 2024-07-01 ENCOUNTER — Ambulatory Visit: Payer: MEDICAID | Admitting: Neurosurgery

## 2024-07-04 NOTE — Progress Notes (Signed)
 Referring Physician:  Valerio Melanie DASEN, NP [address] [place],  [place] [address]  Primary Physician:  Valerio Melanie DASEN, NP  History of Present Illness: 07/09/2024 Martha Oconnor is here today with a chief complaint of neck pain and left arm pain.  This has been ongoing since August.  She has weakness in her left arm.  She has severe pain into her shoulder blade and her upper and lower arm.  She has numbness in her left hand.  She has dropped items due to weakness.  She has been doing physical therapy without improvement.  Martha Oconnor has no symptoms of cervical myelopathy.  The symptoms are causing a significant impact on the patient's life.   I have utilized the care everywhere function in epic to review the outside records available from external health systems.   Progress Note from [place], [place] on 06/10/24:  History of Present Illness: 06/10/2024 Martha Oconnor is here today with a chief complaint of neck pain radiating to her left upper extremity x 8 weeks.  She denies any inciting event and states that she woke up with 1 morning with horrible pain primarily between her shoulder blades that was going down her left arm.  This continued, she eventually went to the ED was given prednisone .  She has been seeing a chiropractor, but her pain has persisted and she is concerned because she feels that her left arm is getting progressively weaker.  She states that is constant tingling, numbness, weakness in her left arm and she often has to hold her left arm above her head to get any kind of relief.  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  She is right-hand dominant.  Denies any issues with gait instability or changes to incontinence.     Weakness: none Bowel/Bladder Dysfunction: none   Conservative measures:  Multimodal medical therapy including regular antiinflammatories:  Injections:  epidural steroid  injections  Review of Systems:  A 10 point review of systems is negative, except for the pertinent positives and negatives detailed in the HPI.  Past Medical History: Past Medical History:  Diagnosis Date   ADHD    Anxiety    Clotting disorder    Cyst of skin 05/27/2021   Degenerative disc disease, lumbar 2024   Depression    Heart murmur    History of kidney stones    Lumbar disc herniation    Lumbar radiculitis    Marijuana use    Migraine    PTSD (post-traumatic stress disorder)     Past Surgical History: Past Surgical History:  Procedure Laterality Date   LUMBAR LAMINECTOMY/DECOMPRESSION MICRODISCECTOMY Left 11/15/2021   Procedure: LEFT L5-S1 MICRODISCECTOMY;  Surgeon: Clois Fret, MD;  Location: [facility] ORS;  Service: Neurosurgery;  Laterality: Left;   LUMBAR LAMINECTOMY/DECOMPRESSION MICRODISCECTOMY Left 08/07/2023   Procedure: LEFT L5-S1 DISCECTOMY;  Surgeon: Clois Fret, MD;  Location: [facility] ORS;  Service: Neurosurgery;  Laterality: Left;   TUBAL LIGATION      Allergies: Allergies as of 07/09/2024 - Review Complete 07/09/2024  Allergen Reaction Noted   Ceclor [cefaclor] Other (See Comments) 01/27/2015    Medications:  Current Outpatient Medications:    gabapentin  (NEURONTIN ) 300 MG capsule, Take 1 capsule (300 mg total) by mouth 3 (three) times daily., Disp: 90 capsule, Rfl: 2   lisdexamfetamine (VYVANSE) 50 MG capsule, Take 50 mg by mouth daily., Disp: , Rfl:   Social History:  Social History   Tobacco Use   Smoking status: Former    Current packs/day: 0.00    Types: Cigarettes    Start date: 05/28/2005    Quit date: 05/28/2013    Years since quitting: 11.1   Smokeless tobacco: Never  Vaping Use   Vaping status: Former   Substances: THC  Substance Use Topics   Alcohol use: Yes    Comment: occ.   Drug use: Yes    Types: Marijuana    Comment: just with migraines    Family Medical History: No family history on file.  Physical  Examination: Vitals:   07/09/24 1607  BP: 130/82    General: Patient is in no apparent distress. Attention to examination is appropriate.  Neck:   Supple.  Full range of motion.  She has discomfort on rotation to the left  Respiratory: Patient is breathing without any difficulty.   NEUROLOGICAL:     Awake, alert, oriented to person, place, and time.  Speech is clear and fluent.   Cranial Nerves: Pupils equal round and reactive to light.  Facial tone is symmetric.  Facial sensation is symmetric. Shoulder shrug is symmetric. Tongue protrusion is midline.  There is no pronator drift.  Strength: Side Biceps Triceps Deltoid Interossei Grip Wrist Ext. Wrist Flex.  R 5 5 5 5 5 5 5   L 4- 4+ 5 4+ 4+ 5 5   Side Iliopsoas Quads Hamstring PF DF EHL  R 5 5 5 5 5 5   L 5 5 5 5 5 5    Reflexes are 1+ and symmetric at the biceps, triceps, brachioradialis, patella and achilles.   Hoffman's is absent.   Bilateral upper and lower extremity sensation is intact to light touch.   She might have some diminished light touch in her left C6 distribution. No evidence of dysmetria noted.  Gait is normal.     Medical Decision Making  Imaging: MRI cervical spine on June 13, 2024 IMPRESSION: 1. Large left subarticular/foraminal disc protrusion at C5-6 with severe left foraminal stenosis. 2. Left foraminal disc protrusions at C2-3 and C4-5 without central spinal canal or neural foraminal stenosis. 3. Small disc bulge with uncovertebral spurring at C3-4 without central spinal canal or neural foraminal stenosis.   Electronically signed by: Franky Stanford MD 06/18/2024 02:48 AM EDT RP Workstation: [id number]   I have personally reviewed the images and agree with the above interpretation.  Assessment and Plan: Ms. Podgurski is a pleasant 33 y.o. female with left-sided C6 radiculopathy.  This been ongoing for 3 months.  She has tried therapy without improvement.  She has objective weakness on  examination.  At this point, 1 could consider surgical intervention.  I recommended that she consider C5-6 cervical disc arthroplasty.  I discussed the planned procedure at length with the patient, including the risks, benefits, alternatives, and indications. The risks discussed include but are not limited to bleeding, infection, need for reoperation, spinal fluid leak, stroke, vision loss, anesthetic complication, coma, paralysis, and even death. We also discussed the possibility of post-operative dysphagia, vocal cord paralysis, and the risk of adjacent segment disease in the future. I also described in detail that improvement was not guaranteed.  The patient expressed understanding of these risks. I described the surgery in layman's terms, and gave ample opportunity for questions, which were answered to the best of my ability.  She would like to think about this and will let me know if she would like to move forward.  Alternatively, we  discussed consideration of an injection.  Given the length of time she has had symptoms and her objective weakness, I do not think this is the best option.  I spent a total of 30 minutes in this patient's care today. This time was spent reviewing pertinent records including imaging studies, obtaining and confirming history, performing a directed evaluation, formulating and discussing my recommendations, and documenting the visit within the medical record.       Thank you for involving me in the care of this patient.      Jilene Spohr K. Clois MD, [facility] Neurosurgery

## 2024-07-09 ENCOUNTER — Ambulatory Visit (INDEPENDENT_AMBULATORY_CARE_PROVIDER_SITE_OTHER): Payer: MEDICAID | Admitting: Neurosurgery

## 2024-07-09 ENCOUNTER — Encounter: Payer: Self-pay | Admitting: Neurosurgery

## 2024-07-09 VITALS — BP 130/82 | Ht 64.0 in | Wt 187.0 lb

## 2024-07-09 DIAGNOSIS — M5412 Radiculopathy, cervical region: Secondary | ICD-10-CM | POA: Diagnosis not present

## 2024-07-09 DIAGNOSIS — R29898 Other symptoms and signs involving the musculoskeletal system: Secondary | ICD-10-CM

## 2024-08-01 ENCOUNTER — Telehealth: Payer: Self-pay | Admitting: Neurosurgery

## 2024-08-01 DIAGNOSIS — R29898 Other symptoms and signs involving the musculoskeletal system: Secondary | ICD-10-CM

## 2024-08-01 DIAGNOSIS — M5412 Radiculopathy, cervical region: Secondary | ICD-10-CM

## 2024-08-01 NOTE — Telephone Encounter (Signed)
 Pt is wanting to plan for sx, and needing a c/b with dates.

## 2024-08-02 NOTE — Telephone Encounter (Signed)
 Pt called back to inquire, I did inform her that we are awaiting a response from Dr.Y

## 2024-08-06 ENCOUNTER — Other Ambulatory Visit: Payer: Self-pay

## 2024-08-06 DIAGNOSIS — R29898 Other symptoms and signs involving the musculoskeletal system: Secondary | ICD-10-CM

## 2024-08-06 DIAGNOSIS — M5412 Radiculopathy, cervical region: Secondary | ICD-10-CM

## 2024-08-06 DIAGNOSIS — Z01818 Encounter for other preprocedural examination: Secondary | ICD-10-CM

## 2024-08-06 NOTE — Telephone Encounter (Signed)
 I spoke with [name]. We discussed the information below.  Planned surgery: C5-6 arthroplasty   Surgery date: 08/19/24 at [facility]: [address] [place], [place] [address]) - you will find out your arrival time the business day before your surgery.   Pre-op appointment at [facility] Pre-admit Testing: you will receive a call with a date/time for this appointment. If you are scheduled for an in person appointment, Pre-admit Testing is located on the first floor of the [facility] building, [address] [facility], [address]. During this appointment, they will advise you which medications you can take the morning of surgery, and which medications you will need to hold for surgery. Labs (such as blood work, EKG) may be done at your pre-op appointment. You are not required to fast for these labs. Should you need to change your pre-op appointment, please call Pre-admit testing at [phone].     Common restrictions after spine surgery: No bending, lifting, or twisting ("BLT"). Avoid lifting objects heavier than 10 pounds for the first 6 weeks after surgery. Where possible, avoid household activities that involve lifting, bending, reaching, pushing, or pulling such as laundry, vacuuming, grocery shopping, and childcare. Try to arrange for help from friends and family for these activities while you heal. Do not drive while taking prescription pain medication. Weeks 6 through 12 after surgery: avoid lifting more than 25 pounds.    X-rays after surgery: Because you are having a fusion or arthroplasty: for appointments after your 2 week follow-up: please arrive our office 30 minutes prior to your appointment for x-rays. This applies to every appointment after your 2 week follow-up. Failure to do so may result in your appointment being rescheduled.    How to contact us :  If you have any questions/concerns before or after surgery, you can reach us  at  [phone], or you can send a mychart message. We can be reached by phone or mychart 8am-4pm, Monday-Friday.  *Please note: Calls after 4pm are forwarded to a third party answering service. Mychart messages are not routinely monitored during evenings, weekends, and holidays. Please call our office to contact the answering service for urgent concerns during non-business hours.   If you have FMLA/disability paperwork, please drop it off or fax it to [phone]   Appointments/FMLA & disability paperwork: Reche Hait, & Nichole Registered Nurse/Surgery scheduler: Denai Caba, RN & Katie, RN Certified Medical Assistants: Don, CMA, Elenor, CMA, Damien, CMA, & Auston, [place] Physician Assistants: Lyle Decamp, PA-C, Edsel Goods, PA-C & Glade Boys, PA-C Surgeons: Penne Sharps, MD & Reeves Daisy, MD   [facility] PREADMIT TESTING VISIT and SURGERY INFORMATION SHEET   Now that surgery has been scheduled you can anticipate several phone calls from [facility] services. A pharmacy technician will call you to verify your current list of medications taken at home.               The Pre-Service Center will call to verify your insurance information and to give you billing estimates and information.             The Preadmit Testing Office will be calling to schedule a visit to obtain information for the anesthesia team and provide instructions on preparation for surgery.  What can you expect for the Preadmit Testing Visit: Appointments may be scheduled in-person or by telephone.  If a telephone visit is scheduled, you may be asked to come into the office to have lab tests or other  studies performed.   This visit will not be completed any greater than 14 days prior to your surgery.  If your surgery has been scheduled for a future date, please do not be alarmed if we have not contacted you to schedule an appointment more than a month prior to the surgery date.    Please  be prepared to provide the following information during this appointment:            -Personal medical history                                               -Medication and allergy list            -Any history of problems with anesthesia              -Recent lab work or diagnostic studies            -Please notify us  of any needs we should be aware of to provide the best care possible           -You will be provided with instructions on how to prepare for your surgery.    On The Day of Surgery:  You must have a driver to take you home after surgery, you will be asked not to drive for 24 hours following surgery.  Taxi, Gisele and non-medical transport will not be acceptable means of transportation unless you have a responsible individual who will be traveling with you.  Visitors in the surgical area:   2 people will be able to visit you in your room once your preparation for surgery has been completed. During surgery, your visitors will be asked to wait in the Surgery Waiting Area.  It is not a requirement for them to stay, if they prefer to leave and come back.  Your visitor(s) will be given an update once the surgery has been completed.  No visitors are allowed in the initial recovery room to respect patient privacy and safety.  Once you are more awake and transfer to the secondary recovery area, or are transferred to an inpatient room, visitors will again be able to see you.  To respect and protect your privacy: We will ask on the day of surgery who your driver will be and what the contact number for that individual will be. We will ask if it is okay to share information with this individual, or if there is an alternative individual that we, or the surgeon, should contact to provide updates and information. If family or friends come to the surgical information desk requesting information about you, who you have not listed with us , no information will be given.   It may be helpful to designate  someone as the main contact who will be responsible for updating your other friends and family.    PREADMIT TESTING OFFICE: [phone] SAME DAY SURGERY: [phone] We look forward to caring for you before and throughout the process of your surgery.

## 2024-08-12 ENCOUNTER — Other Ambulatory Visit: Payer: Self-pay

## 2024-08-12 ENCOUNTER — Inpatient Hospital Stay
Admission: RE | Admit: 2024-08-12 | Discharge: 2024-08-12 | Disposition: A | Payer: MEDICAID | Source: Ambulatory Visit | Attending: Neurosurgery | Admitting: Neurosurgery

## 2024-08-12 DIAGNOSIS — Z01818 Encounter for other preprocedural examination: Secondary | ICD-10-CM

## 2024-08-12 DIAGNOSIS — Z01812 Encounter for preprocedural laboratory examination: Secondary | ICD-10-CM | POA: Diagnosis present

## 2024-08-12 DIAGNOSIS — M5412 Radiculopathy, cervical region: Secondary | ICD-10-CM | POA: Diagnosis not present

## 2024-08-12 LAB — TYPE AND SCREEN
ABO/RH(D): O POS
Antibody Screen: NEGATIVE

## 2024-08-12 LAB — SURGICAL PCR SCREEN
MRSA, PCR: NEGATIVE
Staphylococcus aureus: NEGATIVE

## 2024-08-12 NOTE — Patient Instructions (Addendum)
 Your procedure is scheduled on: MONDAY 08/19/24 Report to the Registration Desk on the 1st floor of the [facility]. To find out your arrival time, please call [phone] between 1PM - 3PM on: FRIDAY 08/16/24 If your arrival time is 6:00 am, do not arrive before that time as the [facility] entrance doors do not open until 6:00 am.  REMEMBER: Instructions that are not followed completely may result in serious medical risk, up to and including death; or upon the discretion of your surgeon and anesthesiologist your surgery may need to be rescheduled.  Do not eat food after midnight the night before surgery.  No gum chewing or hard candies.  You may however, drink CLEAR liquids up to 2 hours before you are scheduled to arrive for your surgery. Do not drink anything within 2 hours of your scheduled arrival time.  Clear liquids include: - water  - apple juice without pulp - gatorade (not RED colors) - black coffee or tea (Do NOT add milk or creamers to the coffee or tea) Do NOT drink anything that is not on this list.  Stop ANY OVER THE COUNTER supplements until after surgery.  You may however, continue to take Tylenol  if needed for pain up until the day of surgery.  Continue taking all of your other prescription medications up until the day of surgery.  ON THE DAY OF SURGERY ONLY TAKE THESE MEDICATIONS WITH SIPS OF WATER:  lisdexamfetamine (VYVANSE)   Use inhalers on the day of surgery and bring to the hospital.  No Alcohol for 24 hours before or after surgery.  No Smoking including e-cigarettes for 24 hours before surgery.  No chewable tobacco products for at least 6 hours before surgery.  No nicotine patches on the day of surgery.  Do not use any recreational drugs for at least a week (preferably 2 weeks) before your surgery.  Please be advised that the combination of cocaine and anesthesia may have negative outcomes, up to and including death. If you test positive for  cocaine, your surgery will be cancelled.  On the morning of surgery brush your teeth with toothpaste and water, you may rinse your mouth with mouthwash if you wish. Do not swallow any toothpaste or mouthwash.  Use CHG Soap or wipes as directed on instruction sheet.  Do not wear jewelry, make-up, hairpins, clips or nail polish.  For welded (permanent) jewelry: bracelets, anklets, waist bands, etc.  Please have this removed prior to surgery.  If it is not removed, there is a chance that hospital personnel will need to cut it off on the day of surgery.  Do not wear lotions, powders, or perfumes.   Do not shave body hair from the neck down 48 hours before surgery.  Contact lenses, hearing aids and dentures may not be worn into surgery.  Do not bring valuables to the hospital. [facility] is not responsible for any missing/lost belongings or valuables.   Notify your doctor if there is any change in your medical condition (cold, fever, infection).  Wear comfortable clothing (specific to your surgery type) to the hospital.  After surgery, you can help prevent lung complications by doing breathing exercises.  Take deep breaths and cough every 1-2 hours. Your doctor may order a device called an Incentive Spirometer to help you take deep breaths.  If you are being admitted to the hospital overnight, leave your suitcase in the car. After surgery it may be brought to your room.  In case of  increased patient census, it may be necessary for you, the patient, to continue your postoperative care in the Same Day Surgery department.  If you are being discharged the day of surgery, you will not be allowed to drive home. You will need a responsible individual to drive you home and stay with you for 24 hours after surgery.   If you are taking public transportation, you will need to have a responsible individual with you.  Please call the Pre-admissions Testing Dept. at [phone] if you have any  questions about these instructions.  Surgery Visitation Policy:  Patients having surgery or a procedure may have two visitors.  Children under the age of 44 must have an adult with them who is not the patient.  Inpatient Visitation:    Visiting hours are 7 a.m. to 8 p.m. Up to four visitors are allowed at one time in a patient room. The visitors may rotate out with other people during the day.  One visitor age 63 or older may stay with the patient overnight and must be in the room by 8 p.m.  Merchandiser, Retail to address health-related social needs:  [email or web address]    Pre-operative 4 CHG Bath Instructions   You can play a key role in reducing the risk of infection after surgery. Your skin needs to be as free of germs as possible. You can reduce the number of germs on your skin by washing with CHG (chlorhexidine  gluconate) soap before surgery. CHG is an antiseptic soap that kills germs and continues to kill germs even after washing.   DO NOT use if you have an allergy to chlorhexidine /CHG or antibacterial soaps. If your skin becomes reddened or irritated, stop using the CHG and notify one of our RNs at [phone].   Please shower with the CHG soap starting 4 days before surgery using the following schedule:     Please keep in mind the following:  DO NOT shave, including legs and underarms, starting the day of your first shower.   You may shave your face at any point before/day of surgery.  Place clean sheets on your bed the day you start using CHG soap. Use a clean washcloth (not used since being washed) for each shower. DO NOT sleep with pets once you start using the CHG.   CHG Shower Instructions:  If you choose to wash your hair and private area, wash first with your normal shampoo/soap.  After you use shampoo/soap, rinse your hair and body thoroughly to remove shampoo/soap residue.  Turn the water OFF and apply about 3 tablespoons (45 ml) of CHG  soap to a CLEAN washcloth.  Apply CHG soap ONLY FROM YOUR NECK DOWN TO YOUR TOES (washing for 3-5 minutes)  DO NOT use CHG soap on face, private areas, open wounds, or sores.  Pay special attention to the area where your surgery is being performed.  If you are having back surgery, having someone wash your back for you may be helpful. Wait 2 minutes after CHG soap is applied, then you may rinse off the CHG soap.  Pat dry with a clean towel  Put on clean clothes/pajamas   If you choose to wear lotion, please use ONLY the CHG-compatible lotions on the back of this paper.     Additional instructions for the day of surgery: DO NOT APPLY any lotions, deodorants, cologne, or perfumes.   Put on clean/comfortable clothes.  Brush your teeth.  Ask your nurse before applying any prescription medications  to the skin.      CHG Compatible Lotions   Aveeno Moisturizing lotion  Cetaphil Moisturizing Cream  Cetaphil Moisturizing Lotion  Clairol Herbal Essence Moisturizing Lotion, Dry Skin  Clairol Herbal Essence Moisturizing Lotion, Extra Dry Skin  Clairol Herbal Essence Moisturizing Lotion, Normal Skin  Curel Age Defying Therapeutic Moisturizing Lotion with Alpha Hydroxy  Curel Extreme Care Body Lotion  Curel Soothing Hands Moisturizing Hand Lotion  Curel Therapeutic Moisturizing Cream, Fragrance-Free  Curel Therapeutic Moisturizing Lotion, Fragrance-Free  Curel Therapeutic Moisturizing Lotion, Original Formula  Eucerin Daily Replenishing Lotion  Eucerin Dry Skin Therapy Plus Alpha Hydroxy Crme  Eucerin Dry Skin Therapy Plus Alpha Hydroxy Lotion  Eucerin Original Crme  Eucerin Original Lotion  Eucerin Plus Crme Eucerin Plus Lotion  Eucerin TriLipid Replenishing Lotion  Keri Anti-Bacterial Hand Lotion  Keri Deep Conditioning Original Lotion Dry Skin Formula Softly Scented  Keri Deep Conditioning Original Lotion, Fragrance Free Sensitive Skin Formula  Keri Lotion Fast Absorbing Fragrance  Free Sensitive Skin Formula  Keri Lotion Fast Absorbing Softly Scented Dry Skin Formula  Keri Original Lotion  Keri Skin Renewal Lotion Keri Silky Smooth Lotion  Keri Silky Smooth Sensitive Skin Lotion  Nivea Body Creamy Conditioning Oil  Nivea Body Extra Enriched Lotion  Nivea Body Original Lotion  Nivea Body Sheer Moisturizing Lotion Nivea Crme  Nivea Skin Firming Lotion  NutraDerm 30 Skin Lotion  NutraDerm Skin Lotion  NutraDerm Therapeutic Skin Cream  NutraDerm Therapeutic Skin Lotion  ProShield Protective Hand Cream  Provon moisturizing lotion     How to Use an Incentive Spirometer  An incentive spirometer is a tool that measures how well you are filling your lungs with each breath. Learning to take long, deep breaths using this tool can help you keep your lungs clear and active. This may help to reverse or lessen your chance of developing breathing (pulmonary) problems, especially infection. You may be asked to use a spirometer: After a surgery. If you have a lung problem or a history of smoking. After a long period of time when you have been unable to move or be active. If the spirometer includes an indicator to show the highest number that you have reached, your health care provider or respiratory therapist will help you set a goal. Keep a log of your progress as told by your health care provider. What are the risks? Breathing too quickly may cause dizziness or cause you to pass out. Take your time so you do not get dizzy or light-headed. If you are in pain, you may need to take pain medicine before doing incentive spirometry. It is harder to take a deep breath if you are having pain. How to use your incentive spirometer  Sit up on the edge of your bed or on a chair. Hold the incentive spirometer so that it is in an upright position. Before you use the spirometer, breathe out normally. Place the mouthpiece in your mouth. Make sure your lips are closed tightly around  it. Breathe in slowly and as deeply as you can through your mouth, causing the piston or the ball to rise toward the top of the chamber. Hold your breath for 3-5 seconds, or for as long as possible. If the spirometer includes a coach indicator, use this to guide you in breathing. Slow down your breathing if the indicator goes above the marked areas. Remove the mouthpiece from your mouth and breathe out normally. The piston or ball will return to the bottom of the chamber. Rest  for a few seconds, then repeat the steps 10 or more times. Take your time and take a few normal breaths between deep breaths so that you do not get dizzy or light-headed. Do this every 1-2 hours when you are awake. If the spirometer includes a goal marker to show the highest number you have reached (best effort), use this as a goal to work toward during each repetition. After each set of 10 deep breaths, cough a few times. This will help to make sure that your lungs are clear. If you have an incision on your chest or abdomen from surgery, place a pillow or a rolled-up towel firmly against the incision when you cough. This can help to reduce pain while taking deep breaths and coughing. General tips When you are able to get out of bed: Walk around often. Continue to take deep breaths and cough in order to clear your lungs. Keep using the incentive spirometer until your health care provider says it is okay to stop using it. If you have been in the hospital, you may be told to keep using the spirometer at home. Contact a health care provider if: You are having difficulty using the spirometer. You have trouble using the spirometer as often as instructed. Your pain medicine is not giving enough relief for you to use the spirometer as told. You have a fever. Get help right away if: You develop shortness of breath. You develop a cough with bloody mucus from the lungs. You have fluid or blood coming from an incision site after  you cough. Summary An incentive spirometer is a tool that can help you learn to take long, deep breaths to keep your lungs clear and active. You may be asked to use a spirometer after a surgery, if you have a lung problem or a history of smoking, or if you have been inactive for a long period of time. Use your incentive spirometer as instructed every 1-2 hours while you are awake. If you have an incision on your chest or abdomen, place a pillow or a rolled-up towel firmly against your incision when you cough. This will help to reduce pain. Get help right away if you have shortness of breath, you cough up bloody mucus, or blood comes from your incision when you cough. This information is not intended to replace advice given to you by your health care provider. Make sure you discuss any questions you have with your health care provider. Document Revised: 06/23/2023 Document Reviewed: 06/23/2023 Elsevier Patient Education  2024 [employer].

## 2024-08-18 MED ORDER — ORAL CARE MOUTH RINSE: 15.0000 mL | Freq: Once | OROMUCOSAL | Status: AC

## 2024-08-18 MED ORDER — LACTATED RINGERS IV SOLN: INTRAVENOUS | Status: DC

## 2024-08-18 MED ORDER — CHLORHEXIDINE GLUCONATE 0.12 % MT SOLN
15.0000 mL | Freq: Once | OROMUCOSAL | Status: AC
  Administered 2024-08-19: 15 mL via OROMUCOSAL

## 2024-08-19 ENCOUNTER — Encounter: Payer: Self-pay | Admitting: Neurosurgery

## 2024-08-19 ENCOUNTER — Other Ambulatory Visit: Payer: Self-pay

## 2024-08-19 ENCOUNTER — Ambulatory Visit: Payer: MEDICAID

## 2024-08-19 ENCOUNTER — Encounter: Admission: RE | Payer: Self-pay

## 2024-08-19 ENCOUNTER — Ambulatory Visit
Admission: RE | Admit: 2024-08-19 | Discharge: 2024-08-19 | Disposition: A | Discharge status: Home / Self Care | Payer: MEDICAID | Attending: Neurosurgery | Admitting: Neurosurgery

## 2024-08-19 ENCOUNTER — Encounter: Payer: MEDICAID | Admitting: Urgent Care

## 2024-08-19 DIAGNOSIS — E669 Obesity, unspecified: Secondary | ICD-10-CM | POA: Insufficient documentation

## 2024-08-19 DIAGNOSIS — R29898 Other symptoms and signs involving the musculoskeletal system: Secondary | ICD-10-CM

## 2024-08-19 DIAGNOSIS — Z87891 Personal history of nicotine dependence: Secondary | ICD-10-CM | POA: Diagnosis not present

## 2024-08-19 DIAGNOSIS — M5412 Radiculopathy, cervical region: Secondary | ICD-10-CM

## 2024-08-19 DIAGNOSIS — F419 Anxiety disorder, unspecified: Secondary | ICD-10-CM | POA: Insufficient documentation

## 2024-08-19 DIAGNOSIS — M4602 Spinal enthesopathy, cervical region: Secondary | ICD-10-CM | POA: Diagnosis not present

## 2024-08-19 DIAGNOSIS — Z01818 Encounter for other preprocedural examination: Secondary | ICD-10-CM

## 2024-08-19 DIAGNOSIS — M4802 Spinal stenosis, cervical region: Secondary | ICD-10-CM | POA: Insufficient documentation

## 2024-08-19 DIAGNOSIS — M2578 Osteophyte, vertebrae: Secondary | ICD-10-CM | POA: Diagnosis not present

## 2024-08-19 DIAGNOSIS — M5011 Cervical disc disorder with radiculopathy,  high cervical region: Secondary | ICD-10-CM | POA: Diagnosis not present

## 2024-08-19 DIAGNOSIS — Z6833 Body mass index (BMI) 33.0-33.9, adult: Secondary | ICD-10-CM | POA: Diagnosis not present

## 2024-08-19 HISTORY — PX: CERVICAL DISC ARTHROPLASTY: SHX587

## 2024-08-19 LAB — POCT PREGNANCY, URINE
Preg Test, Ur: NEGATIVE
Preg Test, Ur: NEGATIVE

## 2024-08-19 SURGERY — CERVICAL ANTERIOR DISC ARTHROPLASTY
Anesthesia: General

## 2024-08-19 MED ORDER — PROPOFOL 10 MG/ML IV BOLUS
INTRAVENOUS | Status: DC | PRN
  Administered 2024-08-19: 200 mg via INTRAVENOUS

## 2024-08-19 MED ORDER — EPHEDRINE 5 MG/ML INJ
INTRAVENOUS | Status: AC
  Filled 2024-08-19: qty 5

## 2024-08-19 MED ORDER — PROPOFOL 1000 MG/100ML IV EMUL
INTRAVENOUS | Status: AC
  Filled 2024-08-19: qty 100

## 2024-08-19 MED ORDER — PHENYLEPHRINE 80 MCG/ML (10ML) SYRINGE FOR IV PUSH (FOR BLOOD PRESSURE SUPPORT)
PREFILLED_SYRINGE | INTRAVENOUS | Status: DC | PRN
  Administered 2024-08-19: 80 ug via INTRAVENOUS

## 2024-08-19 MED ORDER — KETAMINE HCL 50 MG/5ML IJ SOSY
PREFILLED_SYRINGE | INTRAMUSCULAR | Status: AC
  Filled 2024-08-19: qty 5

## 2024-08-19 MED ORDER — HYDROMORPHONE HCL 1 MG/ML IJ SOLN
INTRAMUSCULAR | Status: DC | PRN
  Administered 2024-08-19: 1 mg via INTRAVENOUS

## 2024-08-19 MED ORDER — PHENYLEPHRINE 80 MCG/ML (10ML) SYRINGE FOR IV PUSH (FOR BLOOD PRESSURE SUPPORT)
PREFILLED_SYRINGE | INTRAVENOUS | Status: AC
  Filled 2024-08-19: qty 10

## 2024-08-19 MED ORDER — DROPERIDOL 2.5 MG/ML IJ SOLN: 0.6250 mg | Freq: Once | INTRAMUSCULAR | Status: DC | PRN

## 2024-08-19 MED ORDER — SODIUM BICARBONATE 8.4 % IV SOLN
INTRAVENOUS | Status: AC
  Filled 2024-08-19: qty 50

## 2024-08-19 MED ORDER — ONDANSETRON HCL 4 MG/2ML IJ SOLN
INTRAMUSCULAR | Status: DC | PRN
  Administered 2024-08-19: 4 mg via INTRAVENOUS

## 2024-08-19 MED ORDER — ACETAMINOPHEN 10 MG/ML IV SOLN
INTRAVENOUS | Status: DC | PRN
  Administered 2024-08-19: 1000 mg via INTRAVENOUS

## 2024-08-19 MED ORDER — CEFAZOLIN SODIUM-DEXTROSE 2-4 GM/100ML-% IV SOLN
2.0000 g | Freq: Once | INTRAVENOUS | Status: AC
  Administered 2024-08-19: 2 g via INTRAVENOUS
  Filled 2024-08-19: qty 100

## 2024-08-19 MED ORDER — ONDANSETRON HCL 4 MG/2ML IJ SOLN
INTRAMUSCULAR | Status: AC
  Filled 2024-08-19: qty 2

## 2024-08-19 MED ORDER — ACETAMINOPHEN 10 MG/ML IV SOLN: 1000.0000 mg | Freq: Once | INTRAVENOUS | Status: DC | PRN

## 2024-08-19 MED ORDER — FENTANYL CITRATE (PF) 100 MCG/2ML IJ SOLN
INTRAMUSCULAR | Status: DC | PRN
  Administered 2024-08-19 (×2): 50 ug via INTRAVENOUS

## 2024-08-19 MED ORDER — MIDAZOLAM HCL (PF) 2 MG/2ML IJ SOLN
INTRAMUSCULAR | Status: DC | PRN
  Administered 2024-08-19: 2 mg via INTRAVENOUS

## 2024-08-19 MED ORDER — DEXMEDETOMIDINE HCL IN NACL 80 MCG/20ML IV SOLN
INTRAVENOUS | Status: DC | PRN
  Administered 2024-08-19: 8 ug via INTRAVENOUS

## 2024-08-19 MED ORDER — REMIFENTANIL HCL 1 MG IV SOLR
INTRAVENOUS | Status: AC
  Filled 2024-08-19: qty 1000

## 2024-08-19 MED ORDER — PROPOFOL 500 MG/50ML IV EMUL
INTRAVENOUS | Status: DC | PRN
  Administered 2024-08-19: 50 ug/kg/min via INTRAVENOUS

## 2024-08-19 MED ORDER — MIDAZOLAM HCL 2 MG/2ML IJ SOLN
INTRAMUSCULAR | Status: AC
  Filled 2024-08-19: qty 2

## 2024-08-19 MED ORDER — BUPIVACAINE-EPINEPHRINE (PF) 0.5% -1:200000 IJ SOLN
INTRAMUSCULAR | Status: DC | PRN
  Administered 2024-08-19: 7.5 mL via PERINEURAL

## 2024-08-19 MED ORDER — DEXAMETHASONE SOD PHOSPHATE PF 10 MG/ML IJ SOLN
INTRAMUSCULAR | Status: DC | PRN
  Administered 2024-08-19: 10 mg via INTRAVENOUS

## 2024-08-19 MED ORDER — OXYCODONE HCL 5 MG/5ML PO SOLN: 5.0000 mg | Freq: Once | ORAL | Status: AC | PRN

## 2024-08-19 MED ORDER — EPHEDRINE SULFATE-NACL 50-0.9 MG/10ML-% IV SOSY
PREFILLED_SYRINGE | INTRAVENOUS | Status: DC | PRN
  Administered 2024-08-19 (×2): 5 mg via INTRAVENOUS

## 2024-08-19 MED ORDER — FENTANYL CITRATE (PF) 100 MCG/2ML IJ SOLN
25.0000 ug | INTRAMUSCULAR | Status: DC | PRN
  Administered 2024-08-19 (×4): 25 ug via INTRAVENOUS

## 2024-08-19 MED ORDER — FENTANYL CITRATE (PF) 100 MCG/2ML IJ SOLN
INTRAMUSCULAR | Status: AC
  Filled 2024-08-19: qty 2

## 2024-08-19 MED ORDER — GLYCOPYRROLATE 0.2 MG/ML IJ SOLN
INTRAMUSCULAR | Status: DC | PRN
  Administered 2024-08-19: .2 mg via INTRAVENOUS

## 2024-08-19 MED ORDER — PROPOFOL 10 MG/ML IV BOLUS
INTRAVENOUS | Status: AC
  Filled 2024-08-19: qty 20

## 2024-08-19 MED ORDER — GLYCOPYRROLATE 0.2 MG/ML IJ SOLN
INTRAMUSCULAR | Status: AC
  Filled 2024-08-19: qty 1

## 2024-08-19 MED ORDER — PHENYLEPHRINE HCL-NACL 20-0.9 MG/250ML-% IV SOLN
INTRAVENOUS | Status: DC | PRN
  Administered 2024-08-19: 30 ug/min via INTRAVENOUS

## 2024-08-19 MED ORDER — CEFAZOLIN SODIUM-DEXTROSE 2-4 GM/100ML-% IV SOLN
INTRAVENOUS | Status: AC
  Filled 2024-08-19: qty 100

## 2024-08-19 MED ORDER — SUCCINYLCHOLINE CHLORIDE 200 MG/10ML IV SOSY
PREFILLED_SYRINGE | INTRAVENOUS | Status: DC | PRN
  Administered 2024-08-19: 100 mg via INTRAVENOUS

## 2024-08-19 MED ORDER — SUCCINYLCHOLINE CHLORIDE 200 MG/10ML IV SOSY
PREFILLED_SYRINGE | INTRAVENOUS | Status: AC
  Filled 2024-08-19: qty 10

## 2024-08-19 MED ORDER — HYDROMORPHONE HCL 1 MG/ML IJ SOLN
INTRAMUSCULAR | Status: AC
  Filled 2024-08-19: qty 1

## 2024-08-19 MED ORDER — LACTATED RINGERS IV SOLN: INTRAVENOUS | Status: DC | PRN

## 2024-08-19 MED ORDER — OXYCODONE HCL 5 MG PO TABS
ORAL_TABLET | ORAL | Status: AC
  Filled 2024-08-19: qty 1

## 2024-08-19 MED ORDER — SURGIFLO WITH THROMBIN (HEMOSTATIC MATRIX KIT) OPTIME
TOPICAL | Status: DC | PRN
  Administered 2024-08-19: 1 via TOPICAL

## 2024-08-19 MED ORDER — ACETAMINOPHEN 10 MG/ML IV SOLN
INTRAVENOUS | Status: AC
  Filled 2024-08-19: qty 100

## 2024-08-19 MED ORDER — 0.9 % SODIUM CHLORIDE (POUR BTL) OPTIME
TOPICAL | Status: DC | PRN
  Administered 2024-08-19: 200 mL

## 2024-08-19 MED ORDER — CHLORHEXIDINE GLUCONATE 0.12 % MT SOLN
OROMUCOSAL | Status: AC
  Filled 2024-08-19: qty 15

## 2024-08-19 MED ORDER — LIDOCAINE HCL (PF) 2 % IJ SOLN
INTRAMUSCULAR | Status: AC
  Filled 2024-08-19: qty 5

## 2024-08-19 MED ORDER — SENNA 8.6 MG PO TABS
1.0000 | ORAL_TABLET | Freq: Two times a day (BID) | ORAL | 0 refills | Status: AC | PRN
  Filled 2024-08-19: qty 30, 15d supply, fill #0

## 2024-08-19 MED ORDER — POLYETHYLENE GLYCOL 3350 17 GM/SCOOP PO POWD
17.0000 g | Freq: Every day | ORAL | 0 refills | Status: DC | PRN
  Filled 2024-08-19: qty 238, 14d supply, fill #0

## 2024-08-19 MED ORDER — LIDOCAINE HCL (CARDIAC) PF 100 MG/5ML IV SOSY
PREFILLED_SYRINGE | INTRAVENOUS | Status: DC | PRN
  Administered 2024-08-19: 80 mg via INTRAVENOUS

## 2024-08-19 MED ORDER — OXYCODONE HCL 5 MG PO TABS
5.0000 mg | ORAL_TABLET | ORAL | 0 refills | Status: DC | PRN
  Filled 2024-08-19: qty 30, 5d supply, fill #0

## 2024-08-19 MED ORDER — OXYCODONE HCL 5 MG PO TABS
5.0000 mg | ORAL_TABLET | Freq: Once | ORAL | Status: AC | PRN
  Administered 2024-08-19: 5 mg via ORAL

## 2024-08-19 MED ORDER — METHOCARBAMOL 500 MG PO TABS
500.0000 mg | ORAL_TABLET | Freq: Four times a day (QID) | ORAL | 0 refills | Status: AC
  Filled 2024-08-19: qty 120, 30d supply, fill #0

## 2024-08-19 MED ORDER — KETAMINE HCL 50 MG/5ML IJ SOSY
PREFILLED_SYRINGE | INTRAMUSCULAR | Status: DC | PRN
  Administered 2024-08-19: 30 mg via INTRAVENOUS
  Administered 2024-08-19: 20 mg via INTRAVENOUS

## 2024-08-19 MED ORDER — REMIFENTANIL HCL 1 MG IV SOLR
INTRAVENOUS | Status: DC | PRN
  Administered 2024-08-19: .2 ug/kg/min via INTRAVENOUS

## 2024-08-19 SURGICAL SUPPLY — 24 items
BUR NEURO DRILL SOFT 3.0X3.8M (BURR) ×1 IMPLANT
DERMABOND ADVANCED .7 DNX12 (GAUZE/BANDAGES/DRESSINGS) ×1 IMPLANT
DISC SIMPLIFY SIZE 1 HT 4 (Miscellaneous) IMPLANT
DRAPE C ARM PK CFD 31 SPINE (DRAPES) ×1 IMPLANT
DRAPE LAPAROTOMY 77X122 PED (DRAPES) ×1 IMPLANT
DRAPE SPINE LEICA/WILD 54X150 (DRAPES) ×1 IMPLANT
ELECTRODE REM PT RTRN 9FT ADLT (ELECTROSURGICAL) ×1 IMPLANT
GLOVE BIOGEL PI IND STRL 6.5 (GLOVE) ×1 IMPLANT
GLOVE SURG SYN 6.5 PF PI (GLOVE) ×1 IMPLANT
GLOVE SURG SYN 8.5 PF PI (GLOVE) ×3 IMPLANT
GOWN SRG LRG LVL 4 IMPRV REINF (GOWNS) ×1 IMPLANT
GOWN SRG XL LVL 3 NONREINFORCE (GOWNS) ×1 IMPLANT
KIT TURNOVER KIT A (KITS) ×1 IMPLANT
MANIFOLD NEPTUNE II (INSTRUMENTS) ×1 IMPLANT
NS IRRIG 500ML POUR BTL (IV SOLUTION) ×1 IMPLANT
PACK LAMINECTOMY ARMC (PACKS) ×1 IMPLANT
PAD ARMBOARD POSITIONER FOAM (MISCELLANEOUS) ×2 IMPLANT
SPONGE KITTNER 5P (MISCELLANEOUS) ×1 IMPLANT
SURGIFLO W/THROMBIN 8M KIT (HEMOSTASIS) ×1 IMPLANT
SUT STRATA 3-0 15 PS-2 (SUTURE) ×1 IMPLANT
SUT VICRYL 3-0 CR8 SH (SUTURE) ×1 IMPLANT
SYR 20ML LL LF (SYRINGE) ×1 IMPLANT
TAPE CLOTH 3X10 WHT NS LF (GAUZE/BANDAGES/DRESSINGS) ×2 IMPLANT
TRAP FLUID SMOKE EVACUATOR (MISCELLANEOUS) ×1 IMPLANT

## 2024-08-19 NOTE — Transfer of Care (Signed)
 Immediate Anesthesia Transfer of Care Note  Patient: Martha Oconnor  Procedure(s) Performed: CERVICAL ANTERIOR DISC ARTHROPLASTY  Patient Location: PACU  Anesthesia Type:General  Level of Consciousness: awake, alert , and oriented  Airway & Oxygen Therapy: Patient Spontanous Breathing  Post-op Assessment: Report given to RN and Post -op Vital signs reviewed and stable  Post vital signs: Reviewed and stable  Last Vitals:  Vitals Value Taken Time  BP 133/73 08/19/24 11:17  Temp 36.3 C 08/19/24 11:16  Pulse 88 08/19/24 11:21  Resp 16 08/19/24 11:21  SpO2 98 % 08/19/24 11:21  Vitals shown include unfiled device data.  Last Pain:  Vitals:   08/19/24 0752  TempSrc: Temporal  PainSc: 0-No pain         Complications: No notable events documented.

## 2024-08-19 NOTE — Anesthesia Postprocedure Evaluation (Signed)
"   Anesthesia Post Note  Patient: Martha Oconnor  Procedure(s) Performed: CERVICAL ANTERIOR DISC ARTHROPLASTY  Patient location during evaluation: PACU Anesthesia Type: General Level of consciousness: awake and alert Pain management: pain level controlled Vital Signs Assessment: post-procedure vital signs reviewed and stable Respiratory status: spontaneous breathing, nonlabored ventilation and respiratory function stable Cardiovascular status: blood pressure returned to baseline and stable Postop Assessment: no apparent nausea or vomiting Anesthetic complications: no   No notable events documented.   Last Vitals:  Vitals:   08/19/24 1200 08/19/24 1225  BP: 127/84 (!) 174/94  Pulse: 66 69  Resp: 11 16  Temp: (!) 36.3 C 36.4 C  SpO2: 100% 100%    Last Pain:  Vitals:   08/19/24 1225  TempSrc: Temporal  PainSc: 4                  [name]      "

## 2024-08-19 NOTE — Op Note (Signed)
 Indications: [name] is a 33 y.o. female with M54.12 Cervical radiculopathy, R29.898 Left arm weakness.   Findings: foraminal stenosis  Preoperative Diagnosis: M54.12 Cervical radiculopathy, R29.898 Left arm weakness  Postoperative Diagnosis: same   EBL: 20 ml IVF: see AR Drains: none Disposition: Extubated and Stable to PACU Complications: none  No foley catheter was placed.   Preoperative Note:   Risks of surgery discussed include: infection, bleeding, stroke, coma, death, paralysis, CSF leak, nerve/spinal cord injury, numbness, tingling, weakness, complex regional pain syndrome, recurrent stenosis and/or disc herniation, vascular injury, development of instability, neck/back pain, need for further surgery, persistent symptoms, development of deformity, and the risks of anesthesia. The patient understood these risks and agreed to proceed.  Operative Note:  Procedure:  1) Cervical Disc Arthroplasty at C5/6 using a Nuvasive Simplify   Procedure: After obtaining informed consent, the patient taken to the operating room, placed in supine position, general anesthesia induced.  The patient had a small shoulder roll placed behind the neck.  The patient received preop antibiotics and IV Decadron .  The patient had a neck incision outlined, was prepped and draped in usual sterile fashion. The incision was injected with local anesthetic.   An incision was opened, dissection taken down medial to the carotid artery and jugular vein, lateral to the trachea and esophagus.  The prevertebral fascia identified and a localizing x-ray demonstrated the correct level.  The longus colli were dissected laterally, and self-retaining retractors placed to open the operative field. The microscope was then brought into the field.  With this complete, distractor pins were placed in the vertebral bodies of C5 and C6. The distractor was placed, and the annulus at C5/6 was opened using a bovie.  Curettes and  pituitary rongeurs used to remove the majority of disk, then the drill was used to remove the posterior osteophyte and begin the foraminotomies. The nerve hook was used to elevate the posterior longitudinal ligament, which was then removed with Kerrison rongeurs. The Kerrison was then used to complete the foraminotomies bilaterally. The microblunt nerve hook could be passed out the foramen bilaterally.   Meticulous hemostasis was obtained.  A trial spacer was used to size the disc space. Using flouroscopic guidance, a small footprint 4 mm height Simplify was then inserted in the prepared disc space.  The caspar distractor was removed, and bone wax used for hemostasis. Final AP and lateral radiographs were taken.   With the disc arthroplasty in good position, the wound was irrigated copiously and meticulous hemostasis obtained.  Wound was closed in 2 layers using interrupted inverted 3-0 Vicryl sutures.  The wound was dressed with dermabond, the head of bed at 30 degrees, taken to recovery room in stable condition.  No new postop neurological deficits were identified.  Sponge and pattie counts were correct at the end of the procedure.     I performed the entire procedure with the assistance of Martha Goods PA as an designer, television/film set. An assistant was required for this procedure due to the complexity.  The assistant provided assistance in tissue manipulation and suction, and was required for the successful and safe performance of the procedure. I performed the critical portions of the procedure.   Martha Daisy MD

## 2024-08-19 NOTE — Anesthesia Procedure Notes (Signed)
 Procedure Name: Intubation Date/Time: 08/19/2024 9:30 AM  Performed by: Parisa Pinela, CRNAPre-anesthesia Checklist: Patient identified, Emergency Drugs available, Suction available and Patient being monitored Patient Re-evaluated:Patient Re-evaluated prior to induction Oxygen Delivery Method: Circle System Utilized Preoxygenation: Pre-oxygenation with 100% oxygen Induction Type: IV induction Ventilation: Mask ventilation without difficulty Laryngoscope Size: McGrath and 3 Grade View: Grade I Tube type: Oral Tube size: 7.0 mm Number of attempts: 1 Airway Equipment and Method: Stylet and Oral airway Placement Confirmation: ETT inserted through vocal cords under direct vision, positive ETCO2 and breath sounds checked- equal and bilateral Secured at: 22 cm Tube secured with: Tape Dental Injury: Teeth and Oropharynx as per pre-operative assessment  Comments: Easy, atraumatic intubation. Head and neck midline. Lips, teeth, and tongue same as baseline. Soft bite block placed between molars.

## 2024-08-19 NOTE — Anesthesia Preprocedure Evaluation (Addendum)
"                                    Anesthesia Evaluation  Patient identified by MRN, date of birth, ID band Patient awake    Reviewed: Allergy & Precautions, H&P , NPO status , Patient's Chart, lab work & pertinent test results  Airway Mallampati: II  TM Distance: >3 FB Neck ROM: full    Dental no notable dental hx.    Pulmonary former smoker   Pulmonary exam normal        Cardiovascular negative cardio ROS Normal cardiovascular exam     Neuro/Psych  PSYCHIATRIC DISORDERS Anxiety     Left arm weakness  Neuromuscular disease    GI/Hepatic negative GI ROS, Neg liver ROS,,,  Endo/Other  negative endocrine ROS    Renal/GU      Musculoskeletal   Abdominal  (+) + obese  Peds  Hematology negative hematology ROS (+)   Anesthesia Other Findings Past Medical History: No date: ADHD No date: Anxiety No date: Clotting disorder 05/27/2021: Cyst of skin 2024: Degenerative disc disease, lumbar No date: Depression No date: Heart murmur No date: History of kidney stones No date: Lumbar disc herniation No date: Lumbar radiculitis No date: Marijuana use No date: Migraine No date: PTSD (post-traumatic stress disorder)  Past Surgical History: 11/15/2021: LUMBAR LAMINECTOMY/DECOMPRESSION MICRODISCECTOMY; Left     Comment:  Procedure: LEFT L5-S1 MICRODISCECTOMY;  Surgeon:               Clois Fret, MD;  Location: [facility] OR[facility];  Service:               Neurosurgery;  Laterality: Left; 08/07/2023: LUMBAR LAMINECTOMY/DECOMPRESSION MICRODISCECTOMY; Left     Comment:  Procedure: LEFT L5-S1 DISCECTOMY;  Surgeon: Clois Fret, MD;  Location: [facility];  Service: Neurosurgery;              Laterality: Left; No date: TUBAL LIGATION  BMI    Body Mass Index: 33.30 kg/m      Reproductive/Obstetrics negative OB ROS                              Anesthesia Physical Anesthesia Plan  ASA: 2  Anesthesia Plan: General ETT    Post-op Pain Management: Ofirmev  IV (intra-op)* and Toradol  IV (intra-op)*   Induction: Intravenous  PONV Risk Score and Plan: 2 and Ondansetron , Dexamethasone , Midazolam , Propofol  infusion and TIVA  Airway Management Planned: Oral ETT  Additional Equipment:   Intra-op Plan:   Post-operative Plan: Extubation in OR  Informed Consent: I have reviewed the patients History and Physical, chart, labs and discussed the procedure including the risks, benefits and alternatives for the proposed anesthesia with the patient or authorized representative who has indicated his/her understanding and acceptance.     Dental Advisory Given  Plan Discussed with: CRNA and Surgeon  Anesthesia Plan Comments:          Anesthesia Quick Evaluation  "

## 2024-08-19 NOTE — H&P (Signed)
 "   Referring Physician:  Clois Fret, MD [address] [place],  [place] [address]  Primary Physician:  Valerio Melanie DASEN, NP  History of Present Illness: 08/19/2024 Martha Oconnor presents for surgical intervention.  07/09/2024 Martha Oconnor is here today with a chief complaint of neck pain and left arm pain.  This has been ongoing since August.  She has weakness in her left arm.  She has severe pain into her shoulder blade and her upper and lower arm.  She has numbness in her left hand.  She has dropped items due to weakness.  She has been doing physical therapy without improvement.  Martha Oconnor has no symptoms of cervical myelopathy.  The symptoms are causing a significant impact on the patient's life.   I have utilized the care everywhere function in epic to review the outside records available from external health systems.   Progress Note from [place], [place] on 06/10/24:  History of Present Illness: 06/10/2024 Martha Oconnor is here today with a chief complaint of neck pain radiating to her left upper extremity x 8 weeks.  She denies any inciting event and states that she woke up with 1 morning with horrible pain primarily between her shoulder blades that was going down her left arm.  This continued, she eventually went to the ED was given prednisone .  She has been seeing a chiropractor, but her pain has persisted and she is concerned because she feels that her left arm is getting progressively weaker.  She states that is constant tingling, numbness, weakness in her left arm and she often has to hold her left arm above her head to get any kind of relief.  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  She is right-hand dominant.  Denies any issues with gait instability or changes to incontinence.     Weakness: none Bowel/Bladder Dysfunction: none   Conservative measures:  Multimodal  medical therapy including regular antiinflammatories:  Injections:  epidural steroid injections  Review of Systems:  A 10 point review of systems is negative, except for the pertinent positives and negatives detailed in the HPI.  Past Medical History: Past Medical History:  Diagnosis Date   ADHD    Anxiety    Clotting disorder    Cyst of skin 05/27/2021   Degenerative disc disease, lumbar 2024   Depression    Heart murmur    History of kidney stones    Lumbar disc herniation    Lumbar radiculitis    Marijuana use    Migraine    PTSD (post-traumatic stress disorder)     Past Surgical History: Past Surgical History:  Procedure Laterality Date   LUMBAR LAMINECTOMY/DECOMPRESSION MICRODISCECTOMY Left 11/15/2021   Procedure: LEFT L5-S1 MICRODISCECTOMY;  Surgeon: Clois Fret, MD;  Location: [facility];  Service: Neurosurgery;  Laterality: Left;   LUMBAR LAMINECTOMY/DECOMPRESSION MICRODISCECTOMY Left 08/07/2023   Procedure: LEFT L5-S1 DISCECTOMY;  Surgeon: Clois Fret, MD;  Location: [facility];  Service: Neurosurgery;  Laterality: Left;   TUBAL LIGATION      Allergies: Allergies as of 08/06/2024 - Review Complete 07/09/2024  Allergen Reaction Noted   Ceclor [cefaclor] Other (See Comments) 01/27/2015    Medications:  Current Facility-Administered Medications:    ceFAZolin  (ANCEF ) IVPB 2g/100 mL premix, 2 g, Intravenous, Once, Clois Fret, MD   lactated ringers  infusion, , Intravenous, Continuous, Piscitello, Fairy POUR, MD, Last Rate: 10 mL/hr at 08/19/24 0758,  New Bag at 08/19/24 0758  Social History: Social History   Tobacco Use   Smoking status: Former    Current packs/day: 0.00    Types: Cigarettes    Start date: 05/28/2005    Quit date: 05/28/2013    Years since quitting: 11.2   Smokeless tobacco: Never  Vaping Use   Vaping status: Former   Substances: THC  Substance Use Topics   Alcohol use: Yes    Comment: occ.   Drug use: Yes    Types:  Marijuana    Comment: just with migraines    Family Medical History: History reviewed. No pertinent family history.  Physical Examination: Vitals:   08/19/24 0752  BP: 120/81  Pulse: 99  Resp: 17  Temp: 98.1 F (36.7 C)  SpO2: 96%   Heart sounds normal no MRG. Chest Clear to Auscultation Bilaterally.  General: Patient is in no apparent distress. Attention to examination is appropriate.  Neck:   Supple.  Full range of motion.  She has discomfort on rotation to the left  Respiratory: Patient is breathing without any difficulty.   NEUROLOGICAL:     Awake, alert, oriented to person, place, and time.  Speech is clear and fluent.   Cranial Nerves: Pupils equal round and reactive to light.  Facial tone is symmetric.  Facial sensation is symmetric. Shoulder shrug is symmetric. Tongue protrusion is midline.  There is no pronator drift.  Strength: Side Biceps Triceps Deltoid Interossei Grip Wrist Ext. Wrist Flex.  R 5 5 5 5 5 5 5   L 4- 4+ 5 4+ 4+ 5 5   Side Iliopsoas Quads Hamstring PF DF EHL  R 5 5 5 5 5 5   L 5 5 5 5 5 5    Reflexes are 1+ and symmetric at the biceps, triceps, brachioradialis, patella and achilles.   Hoffman's is absent.   Bilateral upper and lower extremity sensation is intact to light touch.   She might have some diminished light touch in her left C6 distribution. No evidence of dysmetria noted.  Gait is normal.     Medical Decision Making  Imaging: MRI cervical spine on June 13, 2024 IMPRESSION: 1. Large left subarticular/foraminal disc protrusion at C5-6 with severe left foraminal stenosis. 2. Left foraminal disc protrusions at C2-3 and C4-5 without central spinal canal or neural foraminal stenosis. 3. Small disc bulge with uncovertebral spurring at C3-4 without central spinal canal or neural foraminal stenosis.   Electronically signed by: Franky Stanford MD 06/18/2024 02:48 AM EDT RP Workstation: [id number]   I have personally reviewed the  images and agree with the above interpretation.  Assessment and Plan: Martha Oconnor is a pleasant 33 y.o. female with left-sided C6 radiculopathy.  She has objective weakness on examination.  We will proceed with C5-6 cervical disc arthroplasty.     Martha Oconnor K. Clois MD, [facility]  "

## 2024-08-19 NOTE — Discharge Instructions (Addendum)
 " Your surgeon has performed an operation on your cervical spine (neck) to relieve pressure on the spinal cord and/or nerves. This involved making an incision in the front of your neck and removing one or more of the discs that support your spine. Next, a small piece of bone, a titanium plate, and screws were used to fuse two or more of the vertebrae (bones) together.  The following are instructions to help in your recovery once you have been discharged from the hospital. Even if you feel well, it is important that you follow these activity guidelines. If you do not let your neck heal properly from the surgery, you can increase the chance of return of your symptoms and other complications.  anti-inflammatory medications (naproxen  [Aleve ], ibuprofen  [Advil , Motrin ], etc.) are ok to take as needed.   *Regarding compression stockings-  Please wear day and night until you are walking a couple hundred feet three times a day.   Activity    No bending, lifting, or twisting (BLT). Avoid lifting objects heavier than 10 pounds (gallon milk jug).  Where possible, avoid household activities that involve lifting, bending, reaching, pushing, or pulling such as laundry, vacuuming, grocery shopping, and childcare. Try to arrange for help from friends and family for these activities while your back heals.  Increase physical activity slowly as tolerated.  Taking short walks is encouraged, but avoid strenuous exercise. Do not jog, run, bicycle, lift weights, or participate in any other exercises unless specifically allowed by your doctor.  Talk to your doctor before resuming sexual activity.  You should not drive until cleared by your doctor.  Until released by your doctor, you should not return to work or school.  You should rest at home and let your body heal.   You may shower three days after your surgery.  After showering, lightly dab your incision dry. Do not take a tub bath or go swimming until approved by  your doctor at your follow-up appointment.  If your doctor ordered a cervical collar (neck brace) for you, you should wear it whenever you are out of bed. You may remove it when lying down or sleeping, but you should wear it at all other times. Not all neck surgeries require a cervical collar.  If you smoke, we strongly recommend that you quit.  Smoking has been proven to interfere with normal bone healing and will dramatically reduce the success rate of your surgery. Please contact [facility] (800-QUIT-NOW) and use the resources at www.[facility].com for assistance in stopping smoking.  Surgical Incision   If you have a dressing on your incision, you may remove it two days after your surgery. Keep your incision area clean and dry.  If you have staples or stitches on your incision, you should have a follow up scheduled for removal. If you do not have staples or stitches, you will have steri-strips (small pieces of surgical tape) or Dermabond glue. The steri-strips/glue should begin to peel away within about a week (it is fine if the steri-strips fall off before then). If the strips are still in place one week after your surgery, you may gently remove them.  Diet           You may return to your usual diet. However, you may experience discomfort when swallowing in the first month after your surgery. This is normal. You may find that softer foods are more comfortable for you to swallow. Be sure to stay hydrated.  When to Contact Us   You  may experience pain in your neck and/or pain between your shoulder blades. This is normal and should improve in the next few weeks with the help of pain medication, muscle relaxers, and rest. Some patients report that a warm compress on the back of the neck or between the shoulder blades helps.  However, should you experience any of the following, contact us  immediately: New numbness or weakness Pain that is progressively getting worse, and is not relieved by your  pain medication, muscle relaxers, rest, and warm compresses Bleeding, redness, swelling, pain, or drainage from surgical incision Chills or flu-like symptoms Fever greater than 101.0 F (38.3 C) Inability to eat, drink fluids, or take medications Problems with bowel or bladder functions Difficulty breathing or shortness of breath Warmth, tenderness, or swelling in your calf Contact Information How to contact us :  If you have any questions/concerns before or after surgery, you can reach us  at [phone], or you can send a mychart message. We can be reached by phone or mychart 8am-4pm, Monday-Friday.  *Please note: Calls after 4pm are forwarded to a third party answering service. Mychart messages are not routinely monitored during evenings, weekends, and holidays. Please call our office to contact the answering service for urgent concerns during non-business hours.   "

## 2024-08-20 ENCOUNTER — Encounter: Payer: Self-pay | Admitting: Neurosurgery

## 2024-08-23 ENCOUNTER — Other Ambulatory Visit: Payer: Self-pay | Admitting: Neurosurgery

## 2024-08-23 MED ORDER — NAPROXEN 500 MG PO TABS: 500.0000 mg | ORAL_TABLET | Freq: Two times a day (BID) | ORAL | 0 refills | Status: AC

## 2024-08-23 MED ORDER — OXYCODONE HCL 5 MG PO TABS: 5.0000 mg | ORAL_TABLET | ORAL | 0 refills | Status: DC | PRN

## 2024-08-23 NOTE — Progress Notes (Signed)
 Filled meds.  Instructed via call service to take meds as directed.  Added naproxen .

## 2024-08-26 ENCOUNTER — Telehealth: Payer: Self-pay

## 2024-08-26 NOTE — Telephone Encounter (Signed)
-----   Message from Almarie ORN sent at 08/24/2024  9:47 AM EST ----- Dr. Clois has already address this call, but wanted you to be aware.

## 2024-08-26 NOTE — Telephone Encounter (Signed)
 After hours call. Dr. Clois recommendations relayed by after hours answering service, patient agreeable.     Media Information    Document Information  Misc Clinical: AMB Correspondence  ACCESS NURSE CALL LOG  08/23/2024 09:44  Attached To:  Martha Oconnor  Source Information  Default, Provider, MD

## 2024-08-30 NOTE — Progress Notes (Signed)
" ° °  REFERRING PHYSICIAN:  Valerio Melanie ONEIDA Carrolyn [address] [place],  [place] [address]  DOS: 08/19/24  cervical disc arthroplasty C5-C6  HISTORY OF PRESENT ILLNESS: Martha Oconnor is 2 weeks status post above surgery. Given oxycodone , robaxin , and naproxen  on discharge from the hospital.   Preop neck and left arm pain is better. Has also seen improvement in mid thoracic pain between her shoulder blades.   She is having some trouble swallowing but this is improving. She is not taking oxycodone . She is only taking prn robaxin .    PHYSICAL EXAMINATION:  NEUROLOGICAL:  General: In no acute distress.   Awake, alert, oriented to person, place, and time.  Pupils equal round and reactive to light.  Facial tone is symmetric.    Strength: Side Biceps Triceps Deltoid Interossei Grip Wrist Ext. Wrist Flex.  R 5 5 5 5 5 5 5   L 5 5 5 5 5 5 5    Incision c/d/i  Imaging:  Nothing new to review.   Assessment / Plan: Martha Oconnor is doing well s/p above surgery. Treatment options reviewed with patient and following plan made:   - We discussed activity escalation and I have advised the patient to lift up to 10 pounds until 6 weeks after surgery (until follow up with Dr. Clois).   - Reviewed wound care.  - Continue current medications including prn robaxin .  - Follow up as scheduled in 4 weeks and prn.   Advised to contact the office if any questions or concerns arise.   Glade Boys PA-C Dept of Neurosurgery  "

## 2024-09-02 ENCOUNTER — Encounter: Payer: MEDICAID | Admitting: Orthopedic Surgery

## 2024-09-03 ENCOUNTER — Ambulatory Visit: Payer: MEDICAID | Admitting: Orthopedic Surgery

## 2024-09-03 ENCOUNTER — Encounter: Payer: Self-pay | Admitting: Orthopedic Surgery

## 2024-09-03 VITALS — BP 124/70 | Temp 98.3°F | Ht 63.0 in | Wt 188.0 lb

## 2024-09-03 DIAGNOSIS — Z9889 Other specified postprocedural states: Secondary | ICD-10-CM

## 2024-09-03 DIAGNOSIS — M5412 Radiculopathy, cervical region: Secondary | ICD-10-CM

## 2024-09-03 DIAGNOSIS — R29898 Other symptoms and signs involving the musculoskeletal system: Secondary | ICD-10-CM

## 2024-09-24 ENCOUNTER — Other Ambulatory Visit: Payer: Self-pay | Admitting: Neurosurgery

## 2024-09-24 DIAGNOSIS — G959 Disease of spinal cord, unspecified: Secondary | ICD-10-CM

## 2024-10-01 ENCOUNTER — Other Ambulatory Visit: Payer: MEDICAID

## 2024-10-01 ENCOUNTER — Ambulatory Visit: Payer: MEDICAID | Admitting: Neurosurgery

## 2024-10-01 DIAGNOSIS — Z09 Encounter for follow-up examination after completed treatment for conditions other than malignant neoplasm: Secondary | ICD-10-CM

## 2024-10-01 DIAGNOSIS — M5412 Radiculopathy, cervical region: Secondary | ICD-10-CM

## 2024-10-01 NOTE — Progress Notes (Signed)
" ° °  REFERRING PHYSICIAN:  Valerio Melanie ONEIDA Carrolyn [address] [place],  [place] [address]  DOS: 08/19/24  cervical disc arthroplasty C5-C6  HISTORY OF PRESENT ILLNESS: ICELA GLYMPH is status post above surgery.   She is doing very well.  Some mild neck stiffness. Arm symptoms resolved.  PHYSICAL EXAMINATION:  Telephone appointment today    Imaging:  Nothing new to review.   Assessment / Plan: MARYANA PITTMON is doing well s/p above surgery.  She is nearly completely recovered.  I am very pleased with her progress.  We reviewed her activity limitations.  She is cleared to return to work tomorrow.  We will see her back in 6 weeks.   Reeves Daisy MD Dept of Neurosurgery  "

## 2024-11-05 ENCOUNTER — Other Ambulatory Visit: Payer: MEDICAID

## 2024-11-05 ENCOUNTER — Encounter: Payer: MEDICAID | Admitting: Orthopedic Surgery
